# Patient Record
Sex: Female | Born: 1945 | Hispanic: Yes | Marital: Married | Smoking: Former smoker
Health system: Southern US, Community
[De-identification: ages and names within clinical notes are randomized; demographics above are authoritative.]

## PROBLEM LIST (undated history)

## (undated) DIAGNOSIS — L0390 Cellulitis, unspecified: Secondary | ICD-10-CM

## (undated) DIAGNOSIS — F32A Depression, unspecified: Secondary | ICD-10-CM

## (undated) DIAGNOSIS — T4145XA Adverse effect of unspecified anesthetic, initial encounter: Secondary | ICD-10-CM

## (undated) DIAGNOSIS — J449 Chronic obstructive pulmonary disease, unspecified: Secondary | ICD-10-CM

## (undated) DIAGNOSIS — F410 Panic disorder [episodic paroxysmal anxiety] without agoraphobia: Secondary | ICD-10-CM

## (undated) DIAGNOSIS — C539 Malignant neoplasm of cervix uteri, unspecified: Secondary | ICD-10-CM

## (undated) DIAGNOSIS — J189 Pneumonia, unspecified organism: Secondary | ICD-10-CM

## (undated) DIAGNOSIS — R079 Chest pain, unspecified: Secondary | ICD-10-CM

## (undated) DIAGNOSIS — H269 Unspecified cataract: Secondary | ICD-10-CM

## (undated) DIAGNOSIS — F259 Schizoaffective disorder, unspecified: Secondary | ICD-10-CM

## (undated) DIAGNOSIS — T8859XA Other complications of anesthesia, initial encounter: Secondary | ICD-10-CM

## (undated) DIAGNOSIS — I517 Cardiomegaly: Secondary | ICD-10-CM

## (undated) DIAGNOSIS — G4730 Sleep apnea, unspecified: Secondary | ICD-10-CM

## (undated) DIAGNOSIS — R252 Cramp and spasm: Secondary | ICD-10-CM

## (undated) DIAGNOSIS — J45909 Unspecified asthma, uncomplicated: Secondary | ICD-10-CM

## (undated) DIAGNOSIS — E039 Hypothyroidism, unspecified: Secondary | ICD-10-CM

## (undated) DIAGNOSIS — Z8619 Personal history of other infectious and parasitic diseases: Secondary | ICD-10-CM

## (undated) DIAGNOSIS — D649 Anemia, unspecified: Secondary | ICD-10-CM

## (undated) DIAGNOSIS — F329 Major depressive disorder, single episode, unspecified: Secondary | ICD-10-CM

## (undated) DIAGNOSIS — M1990 Unspecified osteoarthritis, unspecified site: Secondary | ICD-10-CM

## (undated) DIAGNOSIS — E785 Hyperlipidemia, unspecified: Secondary | ICD-10-CM

## (undated) DIAGNOSIS — Z9181 History of falling: Secondary | ICD-10-CM

## (undated) DIAGNOSIS — E119 Type 2 diabetes mellitus without complications: Secondary | ICD-10-CM

## (undated) DIAGNOSIS — J40 Bronchitis, not specified as acute or chronic: Secondary | ICD-10-CM

## (undated) DIAGNOSIS — I8390 Asymptomatic varicose veins of unspecified lower extremity: Secondary | ICD-10-CM

## (undated) DIAGNOSIS — F419 Anxiety disorder, unspecified: Secondary | ICD-10-CM

## (undated) DIAGNOSIS — R011 Cardiac murmur, unspecified: Secondary | ICD-10-CM

## (undated) HISTORY — DX: Type 2 diabetes mellitus without complications: E11.9

## (undated) HISTORY — DX: History of falling: Z91.81

## (undated) HISTORY — DX: Unspecified asthma, uncomplicated: J45.909

## (undated) HISTORY — DX: Chronic obstructive pulmonary disease, unspecified: J44.9

## (undated) HISTORY — DX: Personal history of other infectious and parasitic diseases: Z86.19

## (undated) HISTORY — DX: Cardiomegaly: I51.7

## (undated) HISTORY — PX: GASTRIC BYPASS: SHX52

## (undated) HISTORY — PX: GALLBLADDER SURGERY: SHX652

## (undated) HISTORY — DX: Hypothyroidism, unspecified: E03.9

## (undated) HISTORY — DX: Bronchitis, not specified as acute or chronic: J40

## (undated) HISTORY — PX: BREAST LUMPECTOMY: SHX2

## (undated) HISTORY — DX: Schizoaffective disorder, unspecified: F25.9

## (undated) HISTORY — PX: CHOLECYSTECTOMY: SHX55

## (undated) HISTORY — PX: OTHER SURGICAL HISTORY: SHX169

## (undated) HISTORY — DX: Cellulitis, unspecified: L03.90

## (undated) HISTORY — PX: TUBAL LIGATION: SHX77

## (undated) HISTORY — DX: Malignant neoplasm of cervix uteri, unspecified: C53.9

---

## 2003-11-04 ENCOUNTER — Other Ambulatory Visit: Payer: Self-pay

## 2004-03-22 ENCOUNTER — Other Ambulatory Visit: Payer: Self-pay

## 2004-03-23 ENCOUNTER — Inpatient Hospital Stay: Payer: Self-pay | Admitting: Psychiatry

## 2004-04-29 ENCOUNTER — Emergency Department: Payer: Self-pay | Admitting: Emergency Medicine

## 2004-11-10 ENCOUNTER — Emergency Department: Payer: Self-pay | Admitting: Emergency Medicine

## 2004-12-07 ENCOUNTER — Encounter: Payer: Self-pay | Admitting: Anesthesiology

## 2004-12-13 ENCOUNTER — Ambulatory Visit: Payer: Self-pay | Admitting: Anesthesiology

## 2004-12-15 ENCOUNTER — Encounter: Payer: Self-pay | Admitting: Anesthesiology

## 2005-01-26 ENCOUNTER — Ambulatory Visit: Payer: Self-pay | Admitting: Anesthesiology

## 2005-04-13 ENCOUNTER — Ambulatory Visit: Payer: Self-pay | Admitting: Anesthesiology

## 2005-10-05 ENCOUNTER — Ambulatory Visit: Payer: Self-pay

## 2006-06-01 ENCOUNTER — Emergency Department: Payer: Self-pay | Admitting: General Practice

## 2006-10-16 ENCOUNTER — Emergency Department: Payer: Self-pay | Admitting: Emergency Medicine

## 2006-11-06 ENCOUNTER — Inpatient Hospital Stay: Payer: Self-pay | Admitting: Psychiatry

## 2008-05-28 ENCOUNTER — Ambulatory Visit: Payer: Self-pay | Admitting: Podiatry

## 2008-10-26 ENCOUNTER — Ambulatory Visit: Payer: Self-pay

## 2008-11-03 ENCOUNTER — Encounter: Payer: Self-pay | Admitting: Otolaryngology

## 2008-11-14 ENCOUNTER — Encounter: Payer: Self-pay | Admitting: Otolaryngology

## 2008-12-15 ENCOUNTER — Encounter: Payer: Self-pay | Admitting: Otolaryngology

## 2009-01-24 ENCOUNTER — Encounter: Payer: Self-pay | Admitting: Otolaryngology

## 2009-02-14 ENCOUNTER — Encounter: Payer: Self-pay | Admitting: Otolaryngology

## 2009-08-15 ENCOUNTER — Emergency Department: Payer: Self-pay | Admitting: Emergency Medicine

## 2010-03-03 ENCOUNTER — Ambulatory Visit: Payer: Self-pay | Admitting: Podiatry

## 2010-03-06 LAB — PATHOLOGY REPORT

## 2010-11-01 ENCOUNTER — Ambulatory Visit: Payer: Self-pay | Admitting: Ophthalmology

## 2011-01-19 DIAGNOSIS — E559 Vitamin D deficiency, unspecified: Secondary | ICD-10-CM | POA: Insufficient documentation

## 2011-01-19 DIAGNOSIS — E611 Iron deficiency: Secondary | ICD-10-CM | POA: Insufficient documentation

## 2011-01-19 DIAGNOSIS — E039 Hypothyroidism, unspecified: Secondary | ICD-10-CM | POA: Insufficient documentation

## 2011-04-02 ENCOUNTER — Ambulatory Visit: Payer: Self-pay | Admitting: Anesthesiology

## 2011-04-20 ENCOUNTER — Ambulatory Visit: Payer: Self-pay

## 2011-04-20 LAB — RAPID INFLUENZA A&B ANTIGENS

## 2011-04-22 LAB — BETA STREP CULTURE(ARMC)

## 2011-05-18 ENCOUNTER — Ambulatory Visit: Payer: Self-pay | Admitting: Internal Medicine

## 2011-05-22 ENCOUNTER — Ambulatory Visit: Payer: Self-pay | Admitting: Internal Medicine

## 2011-05-24 LAB — BASIC METABOLIC PANEL
BUN: 15 mg/dL (ref 7–18)
Chloride: 106 mmol/L (ref 98–107)
EGFR (Non-African Amer.): 41 — ABNORMAL LOW
Osmolality: 292 (ref 275–301)
Potassium: 4.4 mmol/L (ref 3.5–5.1)
Sodium: 140 mmol/L (ref 136–145)

## 2011-05-24 LAB — CBC CANCER CENTER
Basophil %: 0.1 %
Eosinophil %: 0.3 %
HCT: 37.7 % (ref 35.0–47.0)
HGB: 12.3 g/dL (ref 12.0–16.0)
Lymphocyte %: 18.9 %
Monocyte #: 0.3 x10 3/mm (ref 0.0–0.7)
Neutrophil %: 75.5 %
Platelet: 225 x10 3/mm (ref 150–440)
WBC: 4.9 x10 3/mm (ref 3.6–11.0)

## 2011-05-24 LAB — HEPATIC FUNCTION PANEL A (ARMC)
Alkaline Phosphatase: 93 U/L (ref 50–136)
SGPT (ALT): 50 U/L

## 2011-05-24 LAB — APTT: Activated PTT: 27.4 secs (ref 23.6–35.9)

## 2011-06-02 ENCOUNTER — Ambulatory Visit: Payer: Self-pay | Admitting: Family Medicine

## 2011-06-02 LAB — URINALYSIS, COMPLETE
Bacteria: NEGATIVE
Glucose,UR: NEGATIVE mg/dL (ref 0–75)
Nitrite: NEGATIVE
Specific Gravity: 1.01 (ref 1.003–1.030)

## 2011-06-15 ENCOUNTER — Ambulatory Visit: Payer: Self-pay | Admitting: Internal Medicine

## 2011-06-20 DIAGNOSIS — G4700 Insomnia, unspecified: Secondary | ICD-10-CM | POA: Insufficient documentation

## 2011-06-20 DIAGNOSIS — K589 Irritable bowel syndrome without diarrhea: Secondary | ICD-10-CM | POA: Insufficient documentation

## 2011-06-20 DIAGNOSIS — I10 Essential (primary) hypertension: Secondary | ICD-10-CM | POA: Insufficient documentation

## 2011-06-22 ENCOUNTER — Ambulatory Visit: Payer: Self-pay | Admitting: Internal Medicine

## 2011-09-18 ENCOUNTER — Ambulatory Visit: Payer: Self-pay | Admitting: Family Medicine

## 2012-05-04 ENCOUNTER — Ambulatory Visit: Payer: Self-pay | Admitting: Family Medicine

## 2012-05-08 ENCOUNTER — Emergency Department: Payer: Self-pay | Admitting: Urology

## 2012-05-08 LAB — CK TOTAL AND CKMB (NOT AT ARMC): CK, Total: 54 U/L (ref 21–215)

## 2012-05-08 LAB — COMPREHENSIVE METABOLIC PANEL
Albumin: 3.1 g/dL — ABNORMAL LOW (ref 3.4–5.0)
Alkaline Phosphatase: 97 U/L (ref 50–136)
BUN: 15 mg/dL (ref 7–18)
Chloride: 110 mmol/L — ABNORMAL HIGH (ref 98–107)
Creatinine: 1.19 mg/dL (ref 0.60–1.30)
EGFR (African American): 56 — ABNORMAL LOW
Osmolality: 294 (ref 275–301)
Potassium: 4.8 mmol/L (ref 3.5–5.1)
SGPT (ALT): 84 U/L — ABNORMAL HIGH (ref 12–78)
Sodium: 139 mmol/L (ref 136–145)
Total Protein: 7.1 g/dL (ref 6.4–8.2)

## 2012-05-08 LAB — TROPONIN I: Troponin-I: <0.02 ng/mL

## 2012-05-08 LAB — CBC
HGB: 11.9 g/dL — ABNORMAL LOW (ref 12.0–16.0)
MCH: 33.4 pg (ref 26.0–34.0)
MCHC: 32.4 g/dL (ref 32.0–36.0)

## 2012-07-27 ENCOUNTER — Ambulatory Visit: Payer: Self-pay

## 2012-07-31 DIAGNOSIS — E66812 Obesity, class 2: Secondary | ICD-10-CM | POA: Insufficient documentation

## 2012-09-10 ENCOUNTER — Ambulatory Visit: Payer: Self-pay | Admitting: Internal Medicine

## 2012-10-06 ENCOUNTER — Ambulatory Visit: Payer: Self-pay

## 2012-10-06 LAB — LIPID PANEL
Cholesterol: 172 mg/dL (ref 0–200)
HDL Cholesterol: 66 mg/dL — ABNORMAL HIGH (ref 40–60)
Ldl Cholesterol, Calc: 89 mg/dL (ref 0–100)
Triglycerides: 83 mg/dL (ref 0–200)
VLDL Cholesterol, Calc: 17 mg/dL (ref 5–40)

## 2012-10-06 LAB — CBC WITH DIFFERENTIAL/PLATELET
Basophil #: 0 10*3/uL (ref 0.0–0.1)
Eosinophil #: 0.1 10*3/uL (ref 0.0–0.7)
Eosinophil %: 2.4 %
HCT: 35.4 % (ref 35.0–47.0)
HGB: 11.6 g/dL — ABNORMAL LOW (ref 12.0–16.0)
Lymphocyte %: 35.5 %
MCH: 32.5 pg (ref 26.0–34.0)
MCV: 99 fL (ref 80–100)
Monocyte %: 7.7 %
Neutrophil #: 3.2 10*3/uL (ref 1.4–6.5)
RBC: 3.57 10*6/uL — ABNORMAL LOW (ref 3.80–5.20)

## 2012-10-06 LAB — COMPREHENSIVE METABOLIC PANEL
BUN: 18 mg/dL (ref 7–18)
Bilirubin,Total: 0.3 mg/dL (ref 0.2–1.0)
Chloride: 106 mmol/L (ref 98–107)
Creatinine: 1.17 mg/dL (ref 0.60–1.30)
EGFR (African American): 57 — ABNORMAL LOW
EGFR (Non-African Amer.): 49 — ABNORMAL LOW
Glucose: 95 mg/dL (ref 65–99)
SGOT(AST): 20 U/L (ref 15–37)

## 2012-10-10 LAB — CBC
HGB: 11.1 g/dL — ABNORMAL LOW (ref 12.0–16.0)
MCH: 33 pg (ref 26.0–34.0)
MCV: 100 fL (ref 80–100)
RDW: 15.4 % — ABNORMAL HIGH (ref 11.5–14.5)

## 2012-10-10 LAB — BASIC METABOLIC PANEL
BUN: 18 mg/dL (ref 7–18)
Calcium, Total: 7.9 mg/dL — ABNORMAL LOW (ref 8.5–10.1)
Co2: 21 mmol/L (ref 21–32)
Creatinine: 1.25 mg/dL (ref 0.60–1.30)
EGFR (African American): 53 — ABNORMAL LOW
Glucose: 243 mg/dL — ABNORMAL HIGH (ref 65–99)
Osmolality: 287 (ref 275–301)

## 2012-10-10 LAB — PRO B NATRIURETIC PEPTIDE: B-Type Natriuretic Peptide: 102 pg/mL (ref 0–125)

## 2012-10-11 ENCOUNTER — Observation Stay: Payer: Self-pay | Admitting: Internal Medicine

## 2012-10-11 DIAGNOSIS — I319 Disease of pericardium, unspecified: Secondary | ICD-10-CM

## 2012-10-11 DIAGNOSIS — R079 Chest pain, unspecified: Secondary | ICD-10-CM

## 2012-10-11 LAB — CK TOTAL AND CKMB (NOT AT ARMC)
CK, Total: 64 U/L (ref 21–215)
CK, Total: 65 U/L (ref 21–215)
CK, Total: 59 U/L (ref 21–215)
CK-MB: 1.5 ng/mL (ref 0.5–3.6)
CK-MB: 0.7 ng/mL (ref 0.5–3.6)

## 2012-10-11 LAB — TROPONIN I: Troponin-I: <0.02 ng/mL

## 2012-10-11 LAB — LIPID PANEL
Cholesterol: 152 mg/dL (ref 0–200)
HDL Cholesterol: 65 mg/dL — ABNORMAL HIGH (ref 40–60)
Triglycerides: 91 mg/dL (ref 0–200)

## 2012-10-30 ENCOUNTER — Encounter: Payer: Self-pay | Admitting: Cardiovascular Disease

## 2012-10-30 ENCOUNTER — Ambulatory Visit (INDEPENDENT_AMBULATORY_CARE_PROVIDER_SITE_OTHER): Payer: Medicaid Other | Admitting: Cardiovascular Disease

## 2012-10-30 VITALS — BP 120/60 | HR 86 | Ht 62.5 in | Wt 234.8 lb

## 2012-10-30 DIAGNOSIS — I313 Pericardial effusion (noninflammatory): Secondary | ICD-10-CM | POA: Insufficient documentation

## 2012-10-30 DIAGNOSIS — R079 Chest pain, unspecified: Secondary | ICD-10-CM

## 2012-10-30 DIAGNOSIS — R0602 Shortness of breath: Secondary | ICD-10-CM

## 2012-10-30 DIAGNOSIS — I319 Disease of pericardium, unspecified: Secondary | ICD-10-CM

## 2012-10-30 DIAGNOSIS — I3139 Other pericardial effusion (noninflammatory): Secondary | ICD-10-CM | POA: Insufficient documentation

## 2012-10-30 NOTE — Assessment & Plan Note (Signed)
The patient and husband report prolonged history of chest discomfort associated with dyspnea with minimal activities. She has multiple risk factors for coronary artery disease including diabetes, hypertension, hyperlipidemia, obesity and previous tobacco use. I think once her pericardial effusion results, we can consider further ischemic workup.

## 2012-10-30 NOTE — Progress Notes (Signed)
HPI  This is a 67 year old Hispanic female who is here today for followup visit after recent hospitalization at [facility]. She is here with her husband as well as an interpreter. She has multiple medical problems that include prolonged history of diabetes, obesity, hypertension, hyperlipidemia, COPD with previous tobacco use and schizophrenia. She went to see Dr.Saadat Welton Flakes recently for evaluation of dyspnea and COPD. She underwent an echocardiogram in his office which showed pericardial effusion and thus she was sent to the emergency room at [facility] where she was hospitalized briefly. We repeat an echocardiogram there which showed moderate-sized pericardial effusion With thick fibrinous material without evidence of tomponade. Ejection fraction was normal. She also reported chronic substernal chest discomfort as well as dyspnea with minimal activities. Her cardiac enzymes were negative. Connective tissue disease workup was negative. PPD skin test was placed which came back positive at followup. She reports having sputum that done which was negative for active TB.    No Known Allergies   No current outpatient prescriptions on file prior to visit.   No current facility-administered medications on file prior to visit.     Past Medical History  Diagnosis Date  . Enlarged heart   . Hypothyroidism   . Asthma   . History of shingles   . Cellulitis   . Diabetes mellitus without complication   . History of fall   . Bronchitis   . Cancer of cervix   . Schizoaffective disorder   . COPD (chronic obstructive pulmonary disease)      Past Surgical History  Procedure Laterality Date  . Gallbladder surgery    . Gastric bypass    . Cervical surgery    . Breast lumpectomy    . Cesarean section       History reviewed. No pertinent family history.   History   Social History  . Marital Status: Married    Spouse Name: N/A    Number of Children: N/A  . Years of Education: N/A   Occupational  History  . Not on file.   Social History Main Topics  . Smoking status: Former Smoker -- 1.00 packs/day for 20 years    Types: Cigarettes  . Smokeless tobacco: Not on file  . Alcohol Use: No  . Drug Use: No  . Sexually Active: Not on file   Other Topics Concern  . Not on file   Social History Narrative  . No narrative on file     PHYSICAL EXAM   BP 120/60  Pulse 86  Ht 5' 2.5" (1.588 m)  Wt 234 lb 12 oz (106.482 kg)  BMI 42.23 kg/m2 Constitutional: She is oriented to person, place, and time. She appears well-developed and well-nourished. No distress.  She looks older than her stated age HENT: No nasal discharge.  Head: Normocephalic and atraumatic.  Eyes: Pupils are equal and round. Neck: Normal range of motion. Neck supple. No JVD present. No thyromegaly present.  Cardiovascular: Normal rate, regular rhythm, normal heart sounds. Exam reveals no gallop and no friction rub. No murmur heard.  Pulmonary/Chest: Effort normal and breath sounds normal. No stridor. No respiratory distress. She has no wheezes. She has no rales. She exhibits no tenderness.  Abdominal: Soft. Bowel sounds are normal. She exhibits no distension. There is no tenderness. There is no rebound and no guarding.  Musculoskeletal: Normal range of motion. She exhibits no edema and no tenderness.  Neurological: She is alert and oriented to person, place, and time. Coordination normal.  Skin: Skin is warm and dry. No rash noted. She is not diaphoretic. No erythema. No pallor.  Psychiatric: She has a normal mood and affect. Her behavior is normal. Judgment and thought content normal.     EKG: normal sinus rhythm with low voltage and poor R-wave progression in the anterior leads.   ASSESSMENT AND PLAN

## 2012-10-30 NOTE — Assessment & Plan Note (Signed)
She had a recent pericardial effusion and continues to have chest pain and dyspnea. I recommend a followup echocardiogram to make sure that there is no progression of pericardial effusion. The etiology of her pericardial effusion is still not clear but likely idiopathic. PPD was positive but sputum test was negative.

## 2012-10-30 NOTE — Patient Instructions (Addendum)
Your physician has requested that you have an echocardiogram. Echocardiography is a painless test that uses sound waves to create images of your heart. It provides your doctor with information about the size and shape of your heart and how well your heart's chambers and valves are working. This procedure takes approximately one hour. There are no restrictions for this procedure.  Follow up as needed 

## 2012-11-18 ENCOUNTER — Other Ambulatory Visit (INDEPENDENT_AMBULATORY_CARE_PROVIDER_SITE_OTHER): Payer: Medicaid Other

## 2012-11-18 ENCOUNTER — Other Ambulatory Visit: Payer: Self-pay

## 2012-11-18 DIAGNOSIS — I319 Disease of pericardium, unspecified: Secondary | ICD-10-CM

## 2012-11-18 DIAGNOSIS — I313 Pericardial effusion (noninflammatory): Secondary | ICD-10-CM

## 2012-11-18 DIAGNOSIS — R0602 Shortness of breath: Secondary | ICD-10-CM

## 2012-11-18 DIAGNOSIS — R079 Chest pain, unspecified: Secondary | ICD-10-CM

## 2012-12-02 ENCOUNTER — Encounter: Payer: Self-pay | Admitting: Cardiovascular Disease

## 2012-12-02 ENCOUNTER — Ambulatory Visit (INDEPENDENT_AMBULATORY_CARE_PROVIDER_SITE_OTHER): Payer: Medicaid Other | Admitting: Cardiovascular Disease

## 2012-12-02 VITALS — BP 110/68 | HR 73 | Ht 65.0 in | Wt 235.0 lb

## 2012-12-02 DIAGNOSIS — I319 Disease of pericardium, unspecified: Secondary | ICD-10-CM

## 2012-12-02 DIAGNOSIS — R079 Chest pain, unspecified: Secondary | ICD-10-CM

## 2012-12-02 DIAGNOSIS — I313 Pericardial effusion (noninflammatory): Secondary | ICD-10-CM

## 2012-12-02 NOTE — Assessment & Plan Note (Signed)
Likely related to pericardial effusion. However, if this persists, ischemic evaluation with stress testing will be pursued due to multiple risk factors.

## 2012-12-02 NOTE — Patient Instructions (Addendum)
You have been referred to [facility]. Appointment on Wednesday, December 10, 2012 at 9:30 with Dr Jorja Loa arrive at 9:15 for check-in, office phone number [phone], [address], [place], [place] [address]  Your physician recommends that you continue on your current medications as directed. Please refer to the Current Medication list given to you today.

## 2012-12-02 NOTE — Assessment & Plan Note (Signed)
This was diagnosed in July and still moderate size and persistent on follow up echo 2 week ago. The etiology of this is still not clear. I am concerned about the thick fibrinous material in the pericardial fluid. Even if the effusion resolves without intervention, this might lead to scaring and future constriction. Also tuberculous pericardial disease has not been excluded.  Due to that I recommend pericardial window and biopsy for therapeutic and diagnostic purposes. Risk, benefits and alternatives were discussed.  I am referring her to cardiothoracic surgery.

## 2012-12-02 NOTE — Progress Notes (Signed)
HPI  This is a 67 year old Hispanic female who is here today for followup visit regarding pericardial effusion. She is here with her husband as well as an interpreter. She has multiple medical problems that include prolonged history of diabetes, obesity, hypertension, hyperlipidemia, COPD with previous tobacco use and schizophrenia. She went to see Dr.Saadat Welton Flakes recently for evaluation of dyspnea and COPD. She underwent an echocardiogram in his office which showed pericardial effusion and thus she was sent to the emergency room at [facility] where she was hospitalized briefly in July. We repeated an echocardiogram there while hospitalized which showed moderate-sized pericardial effusion With thick fibrinous material without evidence of tomponade. Ejection fraction was normal. She also reported chronic substernal chest discomfort as well as dyspnea with minimal activities. Her cardiac enzymes were negative. Connective tissue disease workup was negative. PPD skin test was placed which came back positive at followup. [facility] and reports negative sputum test for TB X3 as well as negative testing for HIV and Syphilis.   We repeated the echo 2 weeks ago which showed persistent moderate size pericardial effusion with very thick fibrinous material . She is feeling slightly better but still with chest pain worse with deep breath.   No Known Allergies   Current Outpatient Prescriptions on File Prior to Visit  Medication Sig Dispense Refill  . albuterol (PROVENTIL HFA;VENTOLIN HFA) 108 (90 BASE) MCG/ACT inhaler Inhale 2 puffs into the lungs every 6 (six) hours as needed for wheezing.      . Aspirin Buf,AlHyd-MgHyd-CaCar, (ASCRIPTIN) 325 MG TABS Take by mouth daily.      . benztropine (COGENTIN) 1 MG tablet Take 1 mg by mouth 2 (two) times daily.      . calcitRIOL (ROCALTROL) 0.25 MCG capsule Take 0.25 mcg by mouth daily.      . Cholecalciferol (VITAMIN D3) 50000 UNITS CAPS Take by  mouth every 21 ( twenty-one) days.      . clonazePAM (KLONOPIN) 1 MG tablet Take 2 mg by mouth daily.      . Fluticasone-Salmeterol (ADVAIR) 100-50 MCG/DOSE AEPB Inhale 1 puff into the lungs every 12 (twelve) hours.      Marland Kitchen guaiFENesin-codeine (ROBITUSSIN AC) 100-10 MG/5ML syrup Take 5 mLs by mouth as needed for cough.      Marland Kitchen ibuprofen (ADVIL,MOTRIN) 600 MG tablet Take 600 mg by mouth 3 (three) times daily as needed for pain.      . Liraglutide (VICTOZA) 18 MG/3ML SOPN Inject into the skin daily.      Marland Kitchen lubiprostone (AMITIZA) 24 MCG capsule Take 24 mcg by mouth 2 (two) times daily with a meal.      . metoprolol tartrate (LOPRESSOR) 12.5 mg TABS Take 12.5 mg by mouth 2 (two) times daily.      . simvastatin (ZOCOR) 10 MG tablet Take 10 mg by mouth at bedtime.      . temazepam (RESTORIL) 15 MG capsule Take 15 mg by mouth 2 (two) times daily.      Marland Kitchen tiotropium (SPIRIVA) 18 MCG inhalation capsule Place 18 mcg into inhaler and inhale daily.      Marland Kitchen topiramate (TOPAMAX) 100 MG tablet Take 100 mg by mouth 2 (two) times daily.      . traZODone (DESYREL) 100 MG tablet Take 300 mg by mouth at bedtime.      . ziprasidone (GEODON) 80 MG capsule Take 80 mg by mouth 2 (two) times daily with a meal.  No current facility-administered medications on file prior to visit.     Past Medical History  Diagnosis Date  . Enlarged heart   . Hypothyroidism   . Asthma   . History of shingles   . Cellulitis   . Diabetes mellitus without complication   . History of fall   . Bronchitis   . Cancer of cervix   . Schizoaffective disorder   . COPD (chronic obstructive pulmonary disease)      Past Surgical History  Procedure Laterality Date  . Gallbladder surgery    . Gastric bypass    . Cervical surgery    . Breast lumpectomy    . Cesarean section       No family history on file.   History   Social History  . Marital Status: Married    Spouse Name: N/A    Number of Children: N/A  . Years of  Education: N/A   Occupational History  . Not on file.   Social History Main Topics  . Smoking status: Former Smoker -- 1.00 packs/day for 20 years    Types: Cigarettes  . Smokeless tobacco: Not on file  . Alcohol Use: No  . Drug Use: No  . Sexual Activity: Not on file   Other Topics Concern  . Not on file   Social History Narrative  . No narrative on file     PHYSICAL EXAM   BP 110/68  Pulse 73  Ht 5\' 5"  (1.651 m)  Wt 235 lb (106.595 kg)  BMI 39.11 kg/m2  SpO2 95% Constitutional: She is oriented to person, place, and time. She appears well-developed and well-nourished. No distress.  She looks older than her stated age HENT: No nasal discharge.  Head: Normocephalic and atraumatic.  Eyes: Pupils are equal and round. Neck: Normal range of motion. Neck supple. No JVD present. No thyromegaly present.  Cardiovascular: Normal rate, regular rhythm, normal heart sounds. Exam reveals no gallop and no friction rub. No murmur heard.  Pulmonary/Chest: Effort normal and breath sounds normal. No stridor. No respiratory distress. She has no wheezes. She has no rales. She exhibits no tenderness.  Abdominal: Soft. Bowel sounds are normal. She exhibits no distension. There is no tenderness. There is no rebound and no guarding.  Musculoskeletal: Normal range of motion. She exhibits no edema and no tenderness.  Neurological: She is alert and oriented to person, place, and time. Coordination normal.  Skin: Skin is warm and dry. No rash noted. She is not diaphoretic. No erythema. No pallor.  Psychiatric: She has a normal mood and affect. Her behavior is normal. Judgment and thought content normal.     ASSESSMENT AND PLAN

## 2012-12-10 ENCOUNTER — Encounter: Payer: Medicaid Other | Admitting: Cardiothoracic Surgery

## 2012-12-11 ENCOUNTER — Other Ambulatory Visit: Payer: Self-pay | Admitting: *Deleted

## 2012-12-11 ENCOUNTER — Encounter: Payer: Self-pay | Admitting: Cardiothoracic Surgery

## 2012-12-11 ENCOUNTER — Institutional Professional Consult (permissible substitution) (INDEPENDENT_AMBULATORY_CARE_PROVIDER_SITE_OTHER): Payer: Medicaid Other | Admitting: Cardiothoracic Surgery

## 2012-12-11 VITALS — BP 133/73 | HR 73 | Ht 65.0 in | Wt 235.0 lb

## 2012-12-11 DIAGNOSIS — F209 Schizophrenia, unspecified: Secondary | ICD-10-CM

## 2012-12-11 DIAGNOSIS — I319 Disease of pericardium, unspecified: Secondary | ICD-10-CM

## 2012-12-11 DIAGNOSIS — I313 Pericardial effusion (noninflammatory): Secondary | ICD-10-CM

## 2012-12-11 DIAGNOSIS — R0602 Shortness of breath: Secondary | ICD-10-CM

## 2012-12-11 LAB — BUN: BUN: 17 mg/dL (ref 6–23)

## 2012-12-11 LAB — CREATININE, SERUM: Creat: 1.26 mg/dL — ABNORMAL HIGH (ref 0.50–1.10)

## 2012-12-11 NOTE — Progress Notes (Signed)
PCP is Gwenlyn Found, MD Referring Provider is Iran Ouch, MD  Chief Complaint  Patient presents with  . Pericardial Effusion    Surgcial eval, 2D ECHO 11/18/12    patient examined, most recent 2-D echocardiogram reviewed  HPI: 67 year old obese non- diabetic schizophrenic Hispanic female COPD evaluated by Dr.Arida in [facility] for pericarditis with a significant pericardial effusion which has not resolved on serial echocardiograms. The patient is symptomatic with chest pain and shortness of breath. She denies fever orthopnea or syncope. She is fairly sedentary, fragile and chronically ill with significant psychiatric disease. Her 2-D echocardiogram reveals normal LV function with some loculated areas of pleural effusion measuring up to 2.5 cm. No significant valvular disease is noted. There is some stranding of fibrinous material in the pericardial space. Dr. Kary Kos was concerned that if  there is no resolution of the pericarditis with time and that she could develop constriction if she did not undergo a drainage with pericardial window.  There is no history of coronary disease or prior mark or infarction. No history of significant arrhythmia or prior history of significant CHF.  Past Medical History  Diagnosis Date  . Enlarged heart   . Hypothyroidism   . Asthma   . History of shingles   . Cellulitis   . Diabetes mellitus without complication   . History of fall   . Bronchitis   . Cancer of cervix   . Schizoaffective disorder   . COPD (chronic obstructive pulmonary disease)     Past Surgical History  Procedure Laterality Date  . Gallbladder surgery    . Gastric bypass    . Cervical surgery    . Breast lumpectomy    . Cesarean section      No family history on file.  Social History History  Substance Use Topics  . Smoking status: Former Smoker -- 1.00 packs/day for 20 years    Types: Cigarettes  . Smokeless tobacco: Never Used  . Alcohol Use: No    Current  Outpatient Prescriptions  Medication Sig Dispense Refill  . albuterol (PROVENTIL HFA;VENTOLIN HFA) 108 (90 BASE) MCG/ACT inhaler Inhale 2 puffs into the lungs every 6 (six) hours as needed for wheezing.      . Aspirin Buf,AlHyd-MgHyd-CaCar, (ASCRIPTIN) 325 MG TABS Take by mouth daily.      . benztropine (COGENTIN) 1 MG tablet Take 1 mg by mouth 2 (two) times daily.      . calcitRIOL (ROCALTROL) 0.25 MCG capsule Take 0.25 mcg by mouth daily.      . Cholecalciferol (VITAMIN D3) 50000 UNITS CAPS Take by mouth every 21 ( twenty-one) days.      . clonazePAM (KLONOPIN) 1 MG tablet Take 2 mg by mouth daily.      . Fluticasone-Salmeterol (ADVAIR) 100-50 MCG/DOSE AEPB Inhale 1 puff into the lungs every 12 (twelve) hours.      Marland Kitchen guaiFENesin-codeine (ROBITUSSIN AC) 100-10 MG/5ML syrup Take 5 mLs by mouth as needed for cough.      Marland Kitchen ibuprofen (ADVIL,MOTRIN) 600 MG tablet Take 600 mg by mouth 3 (three) times daily as needed for pain.      . Liraglutide (VICTOZA) 18 MG/3ML SOPN Inject into the skin daily.      Marland Kitchen lubiprostone (AMITIZA) 24 MCG capsule Take 24 mcg by mouth 2 (two) times daily with a meal.      . metoprolol tartrate (LOPRESSOR) 12.5 mg TABS Take 12.5 mg by mouth 2 (two) times daily.      Marland Kitchen  simvastatin (ZOCOR) 10 MG tablet Take 10 mg by mouth at bedtime.      . temazepam (RESTORIL) 15 MG capsule Take 15 mg by mouth 2 (two) times daily.      Marland Kitchen tiotropium (SPIRIVA) 18 MCG inhalation capsule Place 18 mcg into inhaler and inhale daily.      Marland Kitchen topiramate (TOPAMAX) 100 MG tablet Take 100 mg by mouth 2 (two) times daily.      . traZODone (DESYREL) 100 MG tablet Take 300 mg by mouth at bedtime.      . ziprasidone (GEODON) 80 MG capsule Take 80 mg by mouth 2 (two) times daily with a meal.       No current facility-administered medications for this visit.    No Known Allergies  Review of Systems Obese Hispanic female with poor mobility but able to walk down the office hallway with a shuffled gait She  denies diabetes. She denies recent admission for pulmonary problems but does have a history of multiple admissions for COPD flareup and asthma in the past. She denies any bleeding problems or blood disorder Her last hospitalization f was for gastric bypass out of town  BP 133/73  Pulse 73  Ht 5\' 5"  (1.651 m)  Wt 235 lb (106.595 kg)  BMI 39.11 kg/m2  SpO2 97% Physical Exam Chronically ill fragile but obese Hispanic female HEENT normocephalic pupils equal dentition adequate Neck without JVD No bruit Breath sounds diminished bilaterally Cardiac regular rhythm without rub or murmur Abdomen obese nontender Extremities mild pedal edema nonpalpable pedal pulses Neuro generally weak no focal motor deficit  Diagnostic Tests: 2-D echo shows persistent pericardial effusion more significant posteriorly with some fibrinous material and loculation  Impression: Patient has persistent pericarditis very symptomatic and agree that subxiphoid pericardial window with irrigation and debridement of the pericardial space with help the patient's symptoms and possibly prevent further constrictive problems. However she would be at increased risk for surgery to do her chronic pulmonary disease, or psychiatric disorder, and her morbid obesity. The benefit of the surgery as well as the risk of surgery were clearly discussed with the patient and her husband through a formal interpreter Marchelle Folks from [place] (and all aspects of the indications benefits alternatives and risks were discussed and the details of the actual operative procedure also reviewed. I scheduled a CT scan of chest to assess the patient's pulmonary disease prior to surgery which will be scheduled for September 5 at [facility].  Plan:Subxiphoid pericardial window September 5- will need preoperative PFTs and ABGs as well as preoperative CT scan and chest. Patient was given a prescription for Vibramycin to cover a possible mycoplasma pericarditis.

## 2012-12-12 ENCOUNTER — Other Ambulatory Visit: Payer: Self-pay | Admitting: *Deleted

## 2012-12-12 DIAGNOSIS — I313 Pericardial effusion (noninflammatory): Secondary | ICD-10-CM

## 2012-12-16 ENCOUNTER — Encounter (HOSPITAL_COMMUNITY)
Admission: RE | Admit: 2012-12-16 | Discharge: 2012-12-16 | Disposition: A | Discharge status: Home / Self Care | Payer: Medicaid Other | Source: Ambulatory Visit | Attending: Cardiothoracic Surgery | Admitting: Cardiothoracic Surgery

## 2012-12-16 ENCOUNTER — Encounter (HOSPITAL_COMMUNITY): Payer: Self-pay

## 2012-12-16 VITALS — BP 125/68 | HR 81 | Temp 98.0°F | Resp 20 | Ht 62.5 in | Wt 236.1 lb

## 2012-12-16 DIAGNOSIS — I313 Pericardial effusion (noninflammatory): Secondary | ICD-10-CM

## 2012-12-16 HISTORY — DX: Asymptomatic varicose veins of unspecified lower extremity: I83.90

## 2012-12-16 HISTORY — DX: Other complications of anesthesia, initial encounter: T88.59XA

## 2012-12-16 HISTORY — DX: Anemia, unspecified: D64.9

## 2012-12-16 HISTORY — DX: Pneumonia, unspecified organism: J18.9

## 2012-12-16 HISTORY — DX: Adverse effect of unspecified anesthetic, initial encounter: T41.45XA

## 2012-12-16 HISTORY — DX: Unspecified cataract: H26.9

## 2012-12-16 HISTORY — DX: Chest pain, unspecified: R07.9

## 2012-12-16 HISTORY — DX: Sleep apnea, unspecified: G47.30

## 2012-12-16 HISTORY — DX: Cardiac murmur, unspecified: R01.1

## 2012-12-16 HISTORY — DX: Cramp and spasm: R25.2

## 2012-12-16 LAB — COMPREHENSIVE METABOLIC PANEL
ALT: 20 U/L (ref 0–35)
AST: 19 U/L (ref 0–37)
Albumin: 3.4 g/dL — ABNORMAL LOW (ref 3.5–5.2)
Alkaline Phosphatase: 71 U/L (ref 39–117)
BUN: 20 mg/dL (ref 6–23)
CO2: 22 mEq/L (ref 19–32)
Calcium: 8.8 mg/dL (ref 8.4–10.5)
Chloride: 107 mEq/L (ref 96–112)
Creatinine, Ser: 1.09 mg/dL (ref 0.50–1.10)
GFR calc Af Amer: 61 mL/min — ABNORMAL LOW (ref >90)
GFR calc non Af Amer: 52 mL/min — ABNORMAL LOW (ref >90)
Glucose, Bld: 112 mg/dL — ABNORMAL HIGH (ref 70–99)
Potassium: 4.2 mEq/L (ref 3.5–5.1)
Sodium: 141 mEq/L (ref 135–145)
Total Bilirubin: 0.2 mg/dL — ABNORMAL LOW (ref 0.3–1.2)
Total Protein: 6.9 g/dL (ref 6.0–8.3)

## 2012-12-16 LAB — URINE MICROSCOPIC-ADD ON

## 2012-12-16 LAB — URINALYSIS, ROUTINE W REFLEX MICROSCOPIC
Bilirubin Urine: NEGATIVE
Glucose, UA: NEGATIVE mg/dL
Hgb urine dipstick: NEGATIVE
Ketones, ur: NEGATIVE mg/dL
Nitrite: NEGATIVE
Protein, ur: NEGATIVE mg/dL
Specific Gravity, Urine: 1.007 (ref 1.005–1.030)
Urobilinogen, UA: 0.2 mg/dL (ref 0.0–1.0)
pH: 6 (ref 5.0–8.0)

## 2012-12-16 LAB — BLOOD GAS, ARTERIAL
Acid-base deficit: 5.9 mmol/L — ABNORMAL HIGH (ref 0.0–2.0)
Bicarbonate: 19.2 mEq/L — ABNORMAL LOW (ref 20.0–24.0)
Drawn by: 24486
FIO2: 0.21 %
O2 Saturation: 94.3 %
Patient temperature: 98.6
TCO2: 20.4 mmol/L (ref 0–100)
pCO2 arterial: 39.2 mmHg (ref 35.0–45.0)
pH, Arterial: 7.311 — ABNORMAL LOW (ref 7.350–7.450)
pO2, Arterial: 81.2 mmHg (ref 80.0–100.0)

## 2012-12-16 LAB — CBC
HCT: 34.4 % — ABNORMAL LOW (ref 36.0–46.0)
Hemoglobin: 11.5 g/dL — ABNORMAL LOW (ref 12.0–15.0)
MCH: 32.6 pg (ref 26.0–34.0)
MCHC: 33.4 g/dL (ref 30.0–36.0)
MCV: 97.5 fL (ref 78.0–100.0)
Platelets: 222 10*3/uL (ref 150–400)
RBC: 3.53 MIL/uL — ABNORMAL LOW (ref 3.87–5.11)
RDW: 13.8 % (ref 11.5–15.5)
WBC: 6.2 10*3/uL (ref 4.0–10.5)

## 2012-12-16 LAB — PROTIME-INR
INR: 0.99 (ref 0.00–1.49)
Prothrombin Time: 12.9 seconds (ref 11.6–15.2)

## 2012-12-16 LAB — APTT: aPTT: 25 seconds (ref 24–37)

## 2012-12-16 LAB — SURGICAL PCR SCREEN
MRSA, PCR: NEGATIVE
Staphylococcus aureus: POSITIVE — AB

## 2012-12-16 LAB — TYPE AND SCREEN
ABO/RH(D): A POS
Antibody Screen: NEGATIVE

## 2012-12-16 LAB — ABO/RH: ABO/RH(D): A POS

## 2012-12-16 NOTE — Pre-Procedure Instructions (Signed)
Heather Compton  12/16/2012   Your procedure is scheduled on:  December 19, 2012 at 7:30 AM  Report to [facility] at 5:30 AM.  Call this number if you have problems the morning of surgery: [phone]   Remember:   Do not eat food or drink liquids after midnight.   Take these medicines the morning of surgery with A SIP OF WATER: albuterol (PROVENTIL HFA;VENTOLIN HFA) inhaler, benztropine (COGENTIN), clonazePAM (KLONOPIN), metoprolol tartrate (LOPRESSOR), topiramate (TOPAMAX)        Do not wear jewelry, make-up or nail polish.  Do not wear lotions, powders, or perfumes. You may wear deodorant.  Do not shave 48 hours prior to surgery. Men may shave face and neck.  Do not bring valuables to the hospital.  [facility] is not responsible                   for any belongings or valuables.  Contacts, dentures or bridgework may not be worn into surgery.  Leave suitcase in the car. After surgery it may be brought to your room.  For patients admitted to the hospital, checkout time is 11:00 AM the day of  discharge.    Special Instructions: Shower using CHG 2 nights before surgery and the night before surgery.  If you shower the day of surgery use CHG.  Use special wash - you have one bottle of CHG for all showers.  You should use approximately 1/3 of the bottle for each shower.   Please read over the following fact sheets that you were given: Pain Booklet, Coughing and Deep Breathing, Blood Transfusion Information, MRSA Information and Surgical Site Infection Prevention

## 2012-12-17 ENCOUNTER — Telehealth: Payer: Self-pay

## 2012-12-17 DIAGNOSIS — B958 Unspecified staphylococcus as the cause of diseases classified elsewhere: Secondary | ICD-10-CM

## 2012-12-17 MED ORDER — MUPIROCIN 2 % EX OINT: TOPICAL_OINTMENT | Freq: Two times a day (BID) | CUTANEOUS | Status: DC

## 2012-12-17 NOTE — Consult Note (Signed)
Anesthesiology Chart Review:  67 year old female with chronic pericarditis of undetermined etiology. She has symptoms of chest pain and shortness of breath and is scheduled for subxiphoid pericardial window on 12/19/12 by Dr. Donata Clay. Previous ECHO on 11/18/12 showed normal LV function  with some loculated areas of pleural effusion measuring up to 2.5 cm. No significant valvular disease is noted. No evidence of tamponade.   PMH:  1. Longstanding Type 2 DM 2. Hypertension 3. Obesity 4. Schizophrenia 5. COPD/former smoker  Awaiting record of previous sleep study from [facility].  ECG, CXR, labs acceptable for surgery.  Kipp Brood, MD

## 2012-12-17 NOTE — Progress Notes (Signed)
Left message with Alycia Rossetti, RN at Dr. Zenaida Niece Trigt's office that pt has positive staph swab and needs Mupirocin ointment rx called into [facility] on [address] [place], [place]. Spoke with pt's daughter Verlon Au about results. She voiced understanding.

## 2012-12-17 NOTE — Telephone Encounter (Signed)
RX for Mupirocin 2% oint apply BID to both nostrils x 5 days was called to pt's Pharm. Pt notified

## 2012-12-18 ENCOUNTER — Ambulatory Visit
Admission: RE | Admit: 2012-12-18 | Discharge: 2012-12-18 | Disposition: A | Discharge status: Home / Self Care | Payer: Medicaid Other | Source: Ambulatory Visit | Attending: Cardiothoracic Surgery | Admitting: Cardiothoracic Surgery

## 2012-12-18 DIAGNOSIS — I313 Pericardial effusion (noninflammatory): Secondary | ICD-10-CM

## 2012-12-18 DIAGNOSIS — R0602 Shortness of breath: Secondary | ICD-10-CM

## 2012-12-18 LAB — QUANTIFERON TB GOLD ASSAY (BLOOD)
Interferon Gamma Release Assay: NEGATIVE
Mitogen value: 9.05 IU/mL
Quantiferon Nil Value: 0.03 IU/mL
TB Ag value: 0.03 IU/mL
TB Antigen Minus Nil Value: 0 IU/mL

## 2012-12-18 MED ORDER — CEFUROXIME SODIUM 1.5 G IJ SOLR
1.5000 g | INTRAMUSCULAR | Status: AC
  Administered 2012-12-19: 1.5 g via INTRAVENOUS
  Filled 2012-12-18: qty 1.5

## 2012-12-18 MED ORDER — IOHEXOL 300 MG/ML  SOLN
75.0000 mL | Freq: Once | INTRAMUSCULAR | Status: AC | PRN
  Administered 2012-12-18: 75 mL via INTRAVENOUS

## 2012-12-18 NOTE — Progress Notes (Signed)
No Sleep Study received from [place].

## 2012-12-19 ENCOUNTER — Encounter (HOSPITAL_COMMUNITY): Payer: Self-pay | Admitting: *Deleted

## 2012-12-19 ENCOUNTER — Encounter (HOSPITAL_COMMUNITY): Payer: Self-pay | Admitting: Anesthesiology

## 2012-12-19 ENCOUNTER — Inpatient Hospital Stay (HOSPITAL_COMMUNITY)
Admission: RE | Admit: 2012-12-19 | Discharge: 2012-12-24 | DRG: 238 | Disposition: A | Discharge status: Home Health | Payer: Medicaid Other | Source: Ambulatory Visit | Attending: Cardiothoracic Surgery | Admitting: Cardiothoracic Surgery

## 2012-12-19 ENCOUNTER — Encounter (HOSPITAL_COMMUNITY)
Admission: RE | Disposition: A | Discharge status: Home Health | Payer: Self-pay | Source: Ambulatory Visit | Attending: Cardiothoracic Surgery

## 2012-12-19 ENCOUNTER — Encounter (HOSPITAL_COMMUNITY): Payer: Self-pay | Admitting: Pharmacy Technician

## 2012-12-19 ENCOUNTER — Inpatient Hospital Stay (HOSPITAL_COMMUNITY): Payer: Medicaid Other

## 2012-12-19 ENCOUNTER — Ambulatory Visit (HOSPITAL_COMMUNITY): Payer: Medicaid Other | Admitting: Anesthesiology

## 2012-12-19 DIAGNOSIS — J4489 Other specified chronic obstructive pulmonary disease: Secondary | ICD-10-CM | POA: Diagnosis present

## 2012-12-19 DIAGNOSIS — E876 Hypokalemia: Secondary | ICD-10-CM | POA: Diagnosis not present

## 2012-12-19 DIAGNOSIS — R5381 Other malaise: Secondary | ICD-10-CM | POA: Diagnosis present

## 2012-12-19 DIAGNOSIS — G4730 Sleep apnea, unspecified: Secondary | ICD-10-CM | POA: Diagnosis present

## 2012-12-19 DIAGNOSIS — D62 Acute posthemorrhagic anemia: Secondary | ICD-10-CM | POA: Diagnosis not present

## 2012-12-19 DIAGNOSIS — J90 Pleural effusion, not elsewhere classified: Secondary | ICD-10-CM | POA: Diagnosis not present

## 2012-12-19 DIAGNOSIS — Z79899 Other long term (current) drug therapy: Secondary | ICD-10-CM

## 2012-12-19 DIAGNOSIS — Z6841 Body Mass Index (BMI) 40.0 and over, adult: Secondary | ICD-10-CM

## 2012-12-19 DIAGNOSIS — I309 Acute pericarditis, unspecified: Secondary | ICD-10-CM

## 2012-12-19 DIAGNOSIS — I319 Disease of pericardium, unspecified: Secondary | ICD-10-CM

## 2012-12-19 DIAGNOSIS — F209 Schizophrenia, unspecified: Secondary | ICD-10-CM | POA: Diagnosis present

## 2012-12-19 DIAGNOSIS — I10 Essential (primary) hypertension: Secondary | ICD-10-CM | POA: Diagnosis present

## 2012-12-19 DIAGNOSIS — Z87891 Personal history of nicotine dependence: Secondary | ICD-10-CM

## 2012-12-19 DIAGNOSIS — I313 Pericardial effusion (noninflammatory): Secondary | ICD-10-CM

## 2012-12-19 DIAGNOSIS — E119 Type 2 diabetes mellitus without complications: Secondary | ICD-10-CM | POA: Diagnosis present

## 2012-12-19 DIAGNOSIS — F411 Generalized anxiety disorder: Secondary | ICD-10-CM | POA: Diagnosis present

## 2012-12-19 DIAGNOSIS — J449 Chronic obstructive pulmonary disease, unspecified: Secondary | ICD-10-CM | POA: Diagnosis present

## 2012-12-19 DIAGNOSIS — R079 Chest pain, unspecified: Secondary | ICD-10-CM

## 2012-12-19 DIAGNOSIS — J9819 Other pulmonary collapse: Secondary | ICD-10-CM | POA: Diagnosis not present

## 2012-12-19 DIAGNOSIS — R0602 Shortness of breath: Secondary | ICD-10-CM

## 2012-12-19 HISTORY — PX: INTRAOPERATIVE TRANSESOPHAGEAL ECHOCARDIOGRAM: SHX5062

## 2012-12-19 HISTORY — PX: SUBXYPHOID PERICARDIAL WINDOW: SHX5075

## 2012-12-19 LAB — POCT I-STAT 3, ART BLOOD GAS (G3+)
Acid-base deficit: 4 mmol/L — ABNORMAL HIGH (ref 0.0–2.0)
Bicarbonate: 22.8 mEq/L (ref 20.0–24.0)
O2 Saturation: 94 %
Patient temperature: 97
TCO2: 24 mmol/L (ref 0–100)
pCO2 arterial: 48.7 mmHg — ABNORMAL HIGH (ref 35.0–45.0)
pH, Arterial: 7.275 — ABNORMAL LOW (ref 7.350–7.450)
pO2, Arterial: 75 mmHg — ABNORMAL LOW (ref 80.0–100.0)

## 2012-12-19 LAB — BLOOD GAS, ARTERIAL
Acid-base deficit: 3.9 mmol/L — ABNORMAL HIGH (ref 0.0–2.0)
Acid-base deficit: 3.8 mmol/L — ABNORMAL HIGH (ref 0.0–2.0)
Bicarbonate: 22 mEq/L (ref 20.0–24.0)
O2 Content: 2 L/min
O2 Saturation: 96.8 %
Patient temperature: 97.4
Patient temperature: 96.9
TCO2: 24.9 mmol/L (ref 0–100)
TCO2: 23.6 mmol/L (ref 0–100)
pCO2 arterial: 58.8 mmHg (ref 35.0–45.0)
pCO2 arterial: 47.8 mmHg — ABNORMAL HIGH (ref 35.0–45.0)
pH, Arterial: 7.28 — ABNORMAL LOW (ref 7.350–7.450)
pO2, Arterial: 106 mmHg — ABNORMAL HIGH (ref 80.0–100.0)

## 2012-12-19 LAB — GLUCOSE, CAPILLARY
Glucose-Capillary: 97 mg/dL (ref 70–99)
Glucose-Capillary: 75 mg/dL (ref 70–99)
Glucose-Capillary: 99 mg/dL (ref 70–99)
Glucose-Capillary: 147 mg/dL — ABNORMAL HIGH (ref 70–99)
Glucose-Capillary: 132 mg/dL — ABNORMAL HIGH (ref 70–99)

## 2012-12-19 SURGERY — CREATION, PERICARDIAL WINDOW, SUBXIPHOID APPROACH
Anesthesia: General | Wound class: Clean

## 2012-12-19 MED ORDER — ALBUTEROL SULFATE HFA 108 (90 BASE) MCG/ACT IN AERS
2.0000 | INHALATION_SPRAY | Freq: Four times a day (QID) | RESPIRATORY_TRACT | Status: DC | PRN
  Filled 2012-12-19: qty 6.7

## 2012-12-19 MED ORDER — LUBIPROSTONE 24 MCG PO CAPS
24.0000 ug | ORAL_CAPSULE | Freq: Two times a day (BID) | ORAL | Status: DC
Start: 2012-12-20 — End: 2012-12-24
  Administered 2012-12-20 – 2012-12-24 (×9): 24 ug via ORAL
  Filled 2012-12-19 (×12): qty 1

## 2012-12-19 MED ORDER — QUETIAPINE FUMARATE 400 MG PO TABS
800.0000 mg | ORAL_TABLET | Freq: Every day | ORAL | Status: DC
  Administered 2012-12-20 – 2012-12-23 (×4): 800 mg via ORAL
  Filled 2012-12-19 (×5): qty 2

## 2012-12-19 MED ORDER — DEXTROSE 5 % IV SOLN
1.5000 g | Freq: Two times a day (BID) | INTRAVENOUS | Status: DC
  Filled 2012-12-19: qty 1.5

## 2012-12-19 MED ORDER — VITAMIN D (ERGOCALCIFEROL) 1.25 MG (50000 UNIT) PO CAPS
50000.0000 [IU] | ORAL_CAPSULE | ORAL | Status: DC
  Administered 2012-12-20: 50000 [IU] via ORAL
  Filled 2012-12-19: qty 1

## 2012-12-19 MED ORDER — GLYCOPYRROLATE 0.2 MG/ML IJ SOLN
INTRAMUSCULAR | Status: DC | PRN
  Administered 2012-12-19: .8 mg via INTRAVENOUS

## 2012-12-19 MED ORDER — SUCCINYLCHOLINE CHLORIDE 20 MG/ML IJ SOLN
INTRAMUSCULAR | Status: DC | PRN
  Administered 2012-12-19: 120 mg via INTRAVENOUS

## 2012-12-19 MED ORDER — TOPIRAMATE 100 MG PO TABS
100.0000 mg | ORAL_TABLET | Freq: Two times a day (BID) | ORAL | Status: DC
  Administered 2012-12-20 – 2012-12-23 (×8): 100 mg via ORAL
  Filled 2012-12-19 (×11): qty 1

## 2012-12-19 MED ORDER — ROCURONIUM BROMIDE 100 MG/10ML IV SOLN
INTRAVENOUS | Status: DC | PRN
  Administered 2012-12-19: 35 mg via INTRAVENOUS

## 2012-12-19 MED ORDER — LACTATED RINGERS IV SOLN
INTRAVENOUS | Status: DC
  Administered 2012-12-19 (×2): via INTRAVENOUS

## 2012-12-19 MED ORDER — BISACODYL 5 MG PO TBEC
10.0000 mg | DELAYED_RELEASE_TABLET | Freq: Every day | ORAL | Status: DC
  Administered 2012-12-20 – 2012-12-24 (×5): 10 mg via ORAL
  Filled 2012-12-19 (×5): qty 2

## 2012-12-19 MED ORDER — MUPIROCIN 2 % EX OINT
TOPICAL_OINTMENT | Freq: Two times a day (BID) | CUTANEOUS | Status: DC
  Administered 2012-12-19: 23:00:00 via TOPICAL
  Administered 2012-12-20: 1 via TOPICAL
  Administered 2012-12-20 – 2012-12-24 (×8): via TOPICAL
  Filled 2012-12-19: qty 22

## 2012-12-19 MED ORDER — DEXTROSE 5 % IV SOLN
1.5000 g | Freq: Two times a day (BID) | INTRAVENOUS | Status: AC
  Administered 2012-12-19 – 2012-12-20 (×2): 1.5 g via INTRAVENOUS
  Filled 2012-12-19 (×2): qty 1.5

## 2012-12-19 MED ORDER — VITAMIN D3 1.25 MG (50000 UT) PO CAPS: 50000.0000 [IU] | ORAL_CAPSULE | ORAL | Status: DC

## 2012-12-19 MED ORDER — LIRAGLUTIDE 18 MG/3ML ~~LOC~~ SOPN
1.8000 mg | PEN_INJECTOR | Freq: Every day | SUBCUTANEOUS | Status: DC
  Administered 2012-12-20 – 2012-12-21 (×2): 1.8 mg via SUBCUTANEOUS
  Administered 2012-12-22: 10:00:00 via SUBCUTANEOUS
  Administered 2012-12-23 – 2012-12-24 (×2): 1.8 mg via SUBCUTANEOUS

## 2012-12-19 MED ORDER — NEOSTIGMINE METHYLSULFATE 1 MG/ML IJ SOLN
INTRAMUSCULAR | Status: DC | PRN
  Administered 2012-12-19: 5 mg via INTRAVENOUS

## 2012-12-19 MED ORDER — TIOTROPIUM BROMIDE MONOHYDRATE 18 MCG IN CAPS
18.0000 ug | ORAL_CAPSULE | Freq: Every day | RESPIRATORY_TRACT | Status: DC
  Administered 2012-12-20 – 2012-12-23 (×4): 18 ug via RESPIRATORY_TRACT
  Filled 2012-12-19: qty 5

## 2012-12-19 MED ORDER — MIDAZOLAM HCL 5 MG/5ML IJ SOLN
INTRAMUSCULAR | Status: DC | PRN
  Administered 2012-12-19: 2 mg via INTRAVENOUS

## 2012-12-19 MED ORDER — LACTATED RINGERS IV SOLN
INTRAVENOUS | Status: DC | PRN
  Administered 2012-12-19: 15:00:00 via INTRAVENOUS

## 2012-12-19 MED ORDER — SIMVASTATIN 10 MG PO TABS
10.0000 mg | ORAL_TABLET | Freq: Every day | ORAL | Status: DC
  Administered 2012-12-20 – 2012-12-23 (×3): 10 mg via ORAL
  Filled 2012-12-19 (×7): qty 1

## 2012-12-19 MED ORDER — METOPROLOL TARTRATE 12.5 MG HALF TABLET
12.5000 mg | ORAL_TABLET | Freq: Two times a day (BID) | ORAL | Status: DC
  Administered 2012-12-20 (×2): 12.5 mg via ORAL
  Filled 2012-12-19 (×5): qty 1

## 2012-12-19 MED ORDER — CALCITRIOL 0.25 MCG PO CAPS
0.2500 ug | ORAL_CAPSULE | ORAL | Status: DC
  Administered 2012-12-22: 0.25 ug via ORAL
  Filled 2012-12-19 (×4): qty 1

## 2012-12-19 MED ORDER — HYDROMORPHONE HCL PF 1 MG/ML IJ SOLN: 0.2500 mg | INTRAMUSCULAR | Status: DC | PRN

## 2012-12-19 MED ORDER — HYDROCODONE-ACETAMINOPHEN 5-325 MG PO TABS
1.0000 | ORAL_TABLET | ORAL | Status: DC | PRN
  Administered 2012-12-22 – 2012-12-24 (×4): 1 via ORAL
  Filled 2012-12-19 (×4): qty 1

## 2012-12-19 MED ORDER — ONDANSETRON HCL 4 MG/2ML IJ SOLN
INTRAMUSCULAR | Status: DC | PRN
  Administered 2012-12-19: 4 mg via INTRAVENOUS

## 2012-12-19 MED ORDER — MIDAZOLAM HCL 2 MG/2ML IJ SOLN
INTRAMUSCULAR | Status: AC
  Administered 2012-12-19: 1 mg via INTRAVENOUS
  Filled 2012-12-19: qty 2

## 2012-12-19 MED ORDER — POTASSIUM CHLORIDE 10 MEQ/50ML IV SOLN
10.0000 meq | Freq: Every day | INTRAVENOUS | Status: DC | PRN
  Administered 2012-12-22 (×3): 10 meq via INTRAVENOUS
  Filled 2012-12-19: qty 50
  Filled 2012-12-19 (×2): qty 150

## 2012-12-19 MED ORDER — ZIPRASIDONE HCL 80 MG PO CAPS
80.0000 mg | ORAL_CAPSULE | Freq: Two times a day (BID) | ORAL | Status: DC
  Administered 2012-12-20 – 2012-12-23 (×8): 80 mg via ORAL
  Filled 2012-12-19 (×12): qty 1

## 2012-12-19 MED ORDER — FENTANYL CITRATE 0.05 MG/ML IJ SOLN
25.0000 ug | INTRAMUSCULAR | Status: DC | PRN
  Administered 2012-12-19 – 2012-12-21 (×6): 25 ug via INTRAVENOUS
  Filled 2012-12-19 (×5): qty 2

## 2012-12-19 MED ORDER — FENTANYL CITRATE 0.05 MG/ML IJ SOLN
INTRAMUSCULAR | Status: DC | PRN
  Administered 2012-12-19 (×2): 100 ug via INTRAVENOUS

## 2012-12-19 MED ORDER — LIDOCAINE HCL (CARDIAC) 20 MG/ML IV SOLN
INTRAVENOUS | Status: DC | PRN
  Administered 2012-12-19: 50 mg via INTRAVENOUS

## 2012-12-19 MED ORDER — FENTANYL CITRATE 0.05 MG/ML IJ SOLN
50.0000 ug | INTRAMUSCULAR | Status: DC | PRN
Start: 2012-12-19 — End: 2012-12-19
  Administered 2012-12-19: 50 ug via INTRAVENOUS

## 2012-12-19 MED ORDER — ETOMIDATE 2 MG/ML IV SOLN
INTRAVENOUS | Status: DC | PRN
  Administered 2012-12-19: 12 mg via INTRAVENOUS

## 2012-12-19 MED ORDER — MIDAZOLAM HCL 2 MG/2ML IJ SOLN
1.0000 mg | INTRAMUSCULAR | Status: DC | PRN
  Administered 2012-12-19: 1 mg via INTRAVENOUS

## 2012-12-19 MED ORDER — ACETAMINOPHEN 160 MG/5ML PO SOLN
1000.0000 mg | Freq: Four times a day (QID) | ORAL | Status: AC
  Administered 2012-12-20: 1000 mg via ORAL
  Filled 2012-12-19: qty 40.6

## 2012-12-19 MED ORDER — INSULIN ASPART 100 UNIT/ML ~~LOC~~ SOLN
0.0000 [IU] | Freq: Four times a day (QID) | SUBCUTANEOUS | Status: DC
  Administered 2012-12-19: 2 [IU] via SUBCUTANEOUS
  Administered 2012-12-20: 8 [IU] via SUBCUTANEOUS
  Administered 2012-12-21: 2 [IU] via SUBCUTANEOUS
  Administered 2012-12-21: 4 [IU] via SUBCUTANEOUS
  Administered 2012-12-22 (×4): 2 [IU] via SUBCUTANEOUS

## 2012-12-19 MED ORDER — 0.9 % SODIUM CHLORIDE (POUR BTL) OPTIME
TOPICAL | Status: DC | PRN
  Administered 2012-12-19: 1000 mL

## 2012-12-19 MED ORDER — TRAZODONE HCL 150 MG PO TABS
300.0000 mg | ORAL_TABLET | Freq: Every day | ORAL | Status: DC
  Administered 2012-12-20 – 2012-12-23 (×4): 300 mg via ORAL
  Filled 2012-12-19 (×5): qty 2

## 2012-12-19 MED ORDER — FENTANYL CITRATE 0.05 MG/ML IJ SOLN
INTRAMUSCULAR | Status: AC
  Administered 2012-12-19: 25 ug
  Filled 2012-12-19: qty 2

## 2012-12-19 MED ORDER — ACETAMINOPHEN 500 MG PO TABS
1000.0000 mg | ORAL_TABLET | Freq: Four times a day (QID) | ORAL | Status: AC
  Administered 2012-12-20: 1000 mg via ORAL
  Filled 2012-12-19: qty 2

## 2012-12-19 MED ORDER — ALBUMIN HUMAN 5 % IV SOLN
INTRAVENOUS | Status: DC | PRN
  Administered 2012-12-19: 16:00:00 via INTRAVENOUS

## 2012-12-19 MED ORDER — MOMETASONE FURO-FORMOTEROL FUM 100-5 MCG/ACT IN AERO
2.0000 | INHALATION_SPRAY | Freq: Two times a day (BID) | RESPIRATORY_TRACT | Status: DC
  Administered 2012-12-20 – 2012-12-24 (×8): 2 via RESPIRATORY_TRACT
  Filled 2012-12-19: qty 8.8

## 2012-12-19 MED ORDER — OXYCODONE-ACETAMINOPHEN 5-325 MG PO TABS
1.0000 | ORAL_TABLET | ORAL | Status: DC | PRN
  Administered 2012-12-21 (×2): 1 via ORAL
  Filled 2012-12-19 (×2): qty 1

## 2012-12-19 MED ORDER — LEVOTHYROXINE SODIUM 137 MCG PO TABS
137.0000 ug | ORAL_TABLET | Freq: Every day | ORAL | Status: DC
  Administered 2012-12-20 – 2012-12-24 (×5): 137 ug via ORAL
  Filled 2012-12-19 (×6): qty 1

## 2012-12-19 MED ORDER — SENNOSIDES-DOCUSATE SODIUM 8.6-50 MG PO TABS
1.0000 | ORAL_TABLET | Freq: Every evening | ORAL | Status: DC | PRN
  Filled 2012-12-19: qty 1

## 2012-12-19 MED ORDER — ARTIFICIAL TEARS OP OINT
TOPICAL_OINTMENT | OPHTHALMIC | Status: DC | PRN
  Administered 2012-12-19: 1 via OPHTHALMIC

## 2012-12-19 MED ORDER — ONDANSETRON HCL 4 MG/2ML IJ SOLN
4.0000 mg | Freq: Four times a day (QID) | INTRAMUSCULAR | Status: DC | PRN
  Filled 2012-12-19: qty 2

## 2012-12-19 MED ORDER — SODIUM CHLORIDE 0.9 % IV SOLN
INTRAVENOUS | Status: DC | PRN
  Administered 2012-12-19: 16:00:00 via INTRAVENOUS

## 2012-12-19 MED ORDER — CLONAZEPAM 1 MG PO TABS
2.0000 mg | ORAL_TABLET | Freq: Every day | ORAL | Status: DC
  Administered 2012-12-20 – 2012-12-23 (×4): 2 mg via ORAL
  Filled 2012-12-19 (×4): qty 2

## 2012-12-19 MED ORDER — FENTANYL CITRATE 0.05 MG/ML IJ SOLN
INTRAMUSCULAR | Status: AC
  Administered 2012-12-19: 50 ug via INTRAVENOUS
  Filled 2012-12-19: qty 2

## 2012-12-19 SURGICAL SUPPLY — 50 items
ATTRACTOMAT 16X20 MAGNETIC DRP (DRAPES) IMPLANT
BENZOIN TINCTURE PRP APPL 2/3 (GAUZE/BANDAGES/DRESSINGS) IMPLANT
CANISTER SUCTION 2500CC (MISCELLANEOUS) ×3 IMPLANT
CATH THORACIC 28FR (CATHETERS) IMPLANT
CATH THORACIC 28FR RT ANG (CATHETERS) IMPLANT
CATH THORACIC 36FR (CATHETERS) IMPLANT
CATH THORACIC 36FR RT ANG (CATHETERS) IMPLANT
CLOSURE WOUND 1/2 X4 (GAUZE/BANDAGES/DRESSINGS)
CLOTH BEACON ORANGE TIMEOUT ST (SAFETY) ×3 IMPLANT
CONN ST 1/4X3/8  BEN (MISCELLANEOUS) ×2
CONN ST 1/4X3/8 BEN (MISCELLANEOUS) ×1 IMPLANT
CONT SPEC 4OZ CLIKSEAL STRL BL (MISCELLANEOUS) ×6 IMPLANT
COVER SURGICAL LIGHT HANDLE (MISCELLANEOUS) ×3 IMPLANT
DRAIN CHANNEL 28F RND 3/8 FF (WOUND CARE) ×3 IMPLANT
DRAPE LAPAROSCOPIC ABDOMINAL (DRAPES) ×3 IMPLANT
DRAPE PROXIMA HALF (DRAPES) ×3 IMPLANT
DRSG AQUACEL AG ADV 3.5X14 (GAUZE/BANDAGES/DRESSINGS) IMPLANT
ELECT REM PT RETURN 9FT ADLT (ELECTROSURGICAL) ×3
ELECTRODE REM PT RTRN 9FT ADLT (ELECTROSURGICAL) ×1 IMPLANT
GLOVE BIO SURGEON STRL SZ7 (GLOVE) ×3 IMPLANT
GLOVE BIO SURGEON STRL SZ7.5 (GLOVE) ×6 IMPLANT
HEMOSTAT POWDER SURGIFOAM 1G (HEMOSTASIS) ×3 IMPLANT
KIT BASIN OR (CUSTOM PROCEDURE TRAY) ×3 IMPLANT
KIT ROOM TURNOVER OR (KITS) ×3 IMPLANT
NS IRRIG 1000ML POUR BTL (IV SOLUTION) ×3 IMPLANT
PACK CHEST (CUSTOM PROCEDURE TRAY) ×3 IMPLANT
PAD ARMBOARD 7.5X6 YLW CONV (MISCELLANEOUS) ×6 IMPLANT
PAD ELECT DEFIB RADIOL ZOLL (MISCELLANEOUS) ×3 IMPLANT
SPONGE GAUZE 4X4 12PLY (GAUZE/BANDAGES/DRESSINGS) ×3 IMPLANT
STRIP CLOSURE SKIN 1/2X4 (GAUZE/BANDAGES/DRESSINGS) IMPLANT
SUT SILK  1 MH (SUTURE) ×2
SUT SILK 1 MH (SUTURE) ×1 IMPLANT
SUT SILK 2 0 SH CR/8 (SUTURE) ×3 IMPLANT
SUT VIC AB 1 CTX 18 (SUTURE) ×3 IMPLANT
SUT VIC AB 1 CTX 36 (SUTURE)
SUT VIC AB 1 CTX36XBRD ANBCTR (SUTURE) IMPLANT
SUT VIC AB 2-0 CT1 18 (SUTURE) ×3 IMPLANT
SUT VIC AB 3-0 X1 27 (SUTURE) ×3 IMPLANT
SWAB COLLECTION DEVICE MRSA (MISCELLANEOUS) IMPLANT
SYR 50ML SLIP (SYRINGE) IMPLANT
SYRINGE 10CC LL (SYRINGE) IMPLANT
SYSTEM SAHARA CHEST DRAIN ATS (WOUND CARE) IMPLANT
TAPE CLOTH SURG 4X10 WHT LF (GAUZE/BANDAGES/DRESSINGS) ×3 IMPLANT
TOWEL OR 17X24 6PK STRL BLUE (TOWEL DISPOSABLE) ×3 IMPLANT
TOWEL OR 17X26 10 PK STRL BLUE (TOWEL DISPOSABLE) ×6 IMPLANT
TRAP SPECIMEN MUCOUS 40CC (MISCELLANEOUS) ×9 IMPLANT
TRAY FOLEY CATH 14FRSI W/METER (CATHETERS) ×3 IMPLANT
TRAY FOLEY IC TEMP SENS 14FR (CATHETERS) IMPLANT
TUBE ANAEROBIC SPECIMEN COL (MISCELLANEOUS) IMPLANT
WATER STERILE IRR 1000ML POUR (IV SOLUTION) ×6 IMPLANT

## 2012-12-19 NOTE — Progress Notes (Signed)
Echocardiogram 2D Echocardiogram Limited study has been performed.  Heather Compton 12/19/2012, 3:55 PM

## 2012-12-19 NOTE — Anesthesia Postprocedure Evaluation (Signed)
  Anesthesia Post-op Note  Patient: Heather Compton  Procedure(s) Performed: Procedure(s): SUBXYPHOID PERICARDIAL WINDOW (N/A) INTRAOPERATIVE TRANSESOPHAGEAL ECHOCARDIOGRAM (N/A)  Patient Location: PACU  Anesthesia Type:General  Level of Consciousness: awake  Airway and Oxygen Therapy: Patient Spontanous Breathing  Post-op Pain: mild  Post-op Assessment: Post-op Vital signs reviewed  Post-op Vital Signs: Reviewed  Complications: No apparent anesthesia complications

## 2012-12-19 NOTE — Anesthesia Preprocedure Evaluation (Addendum)
Anesthesia Evaluation  Patient identified by MRN, date of birth, ID band Patient awake    Reviewed: Allergy & Precautions, H&P , NPO status , Patient's Chart, lab work & pertinent test results  Airway Mallampati: II      Dental   Pulmonary shortness of breath and with exertion, asthma , sleep apnea , pneumonia -, COPD breath sounds clear to auscultation        Cardiovascular + Valvular Problems/Murmurs Rhythm:Regular Rate:Normal  Cardiac history noted. CE   Neuro/Psych Anxiety    GI/Hepatic Neg liver ROS,   Endo/Other  diabetes  Renal/GU negative Renal ROS     Musculoskeletal   Abdominal   Peds  Hematology   Anesthesia Other Findings   Reproductive/Obstetrics                          Anesthesia Physical Anesthesia Plan  ASA: IV  Anesthesia Plan: General   Post-op Pain Management:    Induction: Intravenous  Airway Management Planned: Oral ETT  Additional Equipment: Arterial line  Intra-op Plan:   Post-operative Plan: Possible Post-op intubation/ventilation  Informed Consent:   Dental advisory given  Plan Discussed with: CRNA, Anesthesiologist and Surgeon  Anesthesia Plan Comments:        Anesthesia Quick Evaluation

## 2012-12-19 NOTE — Anesthesia Procedure Notes (Signed)
Procedures RIJ CVP Dual Lumen 1350-1405: The patient was identified and consent obtained.  TO was performed, and full barrier precautions were used.  The skin was anesthetized with lidocaine.  Once the vein was located with the 22 ga. needle using ultrasound guidance , the wire was inserted into the vein.  The wire location was confirmed with ultrasound.  The tissue was dilated and the catheter was carefully inserted, then sutured in place. A dressing was applied. The patient tolerated the procedure well.   CE

## 2012-12-19 NOTE — Progress Notes (Signed)
The patient was examined and preop studies reviewed. There has been no change from the prior exam and the patient is ready for surgery.   Plan subxyphoid pericardial window on [place] for pericardial effusion, still present on 2D echo Today

## 2012-12-19 NOTE — Op Note (Signed)
Heather Compton, Heather Compton NO.:  [id number]  MEDICAL RECORD NO.:  [id number]  LOCATION:  2S06C                        FACILITY:  [facility]  PHYSICIAN:  Kerin Perna, M.D.  DATE OF BIRTH:  February 17, 1946  DATE OF PROCEDURE:  12/19/2012 DATE OF DISCHARGE:                              OPERATIVE REPORT   OPERATION:  Subxiphoid pericardial window.  PREOPERATIVE DIAGNOSIS:  Pericarditis with pericardial effusion.  POSTOPERATIVE DIAGNOSIS:  Pericarditis with pericardial effusion.  SURGEON:  Kerin Perna, M.D.  ASSISTANT:  Rowe Clack, PA-C.  ANESTHESIA:  General by Dr. Judie Petit with intraoperative transesophageal echocardiogram.  INDICATIONS:  The patient is a 67 year old obese, nonsmoker female from [place] with schizoaffective disorder, and being followed by [facility] in [place] for pericarditis and persistent pericardial effusion.  Because medical measures did not improve the effusion, she was felt to be a candidate for subxiphoid pericardial window.  Prior to surgery, I examined the patient in the office and reviewed results of the echocardiogram with the patient and her husband through an interpreter named Marchelle Folks provided by [employer].  I discussed the indications, benefits, use of general anesthesia, location of surgical incision, and expected recovery.  I discussed the risks of bleeding, infection, and respiratory-pulmonary problems due to her morbid obesity.  She understood and agreed to proceed with surgery under informed consent.  OPERATIVE FINDINGS: 1. 250 mL of crystal clear fluid in the pericardial space. 2. Pericardial tissue was not inflamed or thickened. 3. No epicarditis was noted or nodularity on the epicardium. 4. No blood clots or fibrinous stranding in the pericardial space.  DESCRIPTION OF PROCEDURE:  The patient was brought to the operating room and placed supine on the operating table, general  anesthesia was induced.  A proper time-out was performed after the patient was prepped and draped as a sterile field.  A small 2-3 inch incision was made centered on the xiphoid.  Dissection was carried down through the deep layer of fat.  The xiphoid was disarticulated and removed gently.  The distal sternum was elevated with a sternal elevating retractor and soft tissue anterior to the pericardium was dissected away.  The pericardium was incised with a #15 blade and there was immediate spurt of clear fluid under pressure.  The incision was extended with the surgical scissors leaving a large window measuring 2.5 x 2.5 cm.  The tissue was submitted for Pathology and cultures.  Fluid was drained completely. There was no loculated pockets of fluid seen.  The echocardiogram showed minimal residual effusion.  A 28-French soft pericardial drain was placed and brought out through separate incisions and secured to the skin and connected to an underwater seal Pleur-Evac system.  The sternal elevating retractor was removed.  The fascia was closed with interrupted #1 Vicryl.  The subcutaneous and skin layers were closed in running Vicryl and sterile dressings were applied.  The patient was extubated in the recovery room, observed, and then transferred to the recovery room.     Kerin Perna, M.D.     PV/MEDQ  D:  12/19/2012  T:  12/19/2012  Job:  [id number]  cc:   Lorine Bears, MD

## 2012-12-19 NOTE — Brief Op Note (Addendum)
      [address]       [place] [address]             [phone]     12/19/2012  4:44 PM  PATIENT:  Heather Compton  67 y.o. female  PRE-OPERATIVE DIAGNOSIS:  pericardial effusion  POST-OPERATIVE DIAGNOSIS:  pericardial effusion  PROCEDURE:  Procedure(s): SUBXYPHOID PERICARDIAL WINDOW INTRAOPERATIVE TRANSESOPHAGEAL ECHOCARDIOGRAM  SURGEON:  Surgeon(s): Kerin Perna, MD  PHYSICIAN ASSISTANT: WAYNE GOLD PA-C  ANESTHESIA:   general  PATIENT CONDITION:  PACU - hemodynamically stable.  PRE-OPERATIVE WEIGHT: 107kg  COMPLICATIONS: NO KNOWN  FINDINGS: 200CC CLEAR FLUID, NO EPICARDIAL INFLAMMATION  SPECIMEN: FLUID FOR CULTURE/CYTOLOGY; PERICARDIAL TISSUE FOR PATHOLOGY   Clinical Impression No evidence of malignancy or infection Etiology prob muld viral percarditis

## 2012-12-19 NOTE — Transfer of Care (Signed)
Immediate Anesthesia Transfer of Care Note  Patient: Heather Compton  Procedure(s) Performed: Procedure(s): SUBXYPHOID PERICARDIAL WINDOW (N/A) INTRAOPERATIVE TRANSESOPHAGEAL ECHOCARDIOGRAM (N/A)  Patient Location: PACU  Anesthesia Type:General  Level of Consciousness: awake, alert  and oriented  Airway & Oxygen Therapy: Patient Spontanous Breathing and Patient connected to face mask oxygen  Post-op Assessment: Report given to PACU RN  Post vital signs: Reviewed and stable  Complications: No apparent anesthesia complications

## 2012-12-19 NOTE — Preoperative (Signed)
Beta Blockers   Reason not to administer Beta Blockers:Not Applicable 

## 2012-12-20 ENCOUNTER — Inpatient Hospital Stay (HOSPITAL_COMMUNITY): Payer: Medicaid Other

## 2012-12-20 LAB — POCT I-STAT 3, ART BLOOD GAS (G3+)
Acid-base deficit: 4 mmol/L — ABNORMAL HIGH (ref 0.0–2.0)
Acid-base deficit: 3 mmol/L — ABNORMAL HIGH (ref 0.0–2.0)
Bicarbonate: 23.5 mEq/L (ref 20.0–24.0)
Bicarbonate: 23.3 mEq/L (ref 20.0–24.0)
O2 Saturation: 94 %
O2 Saturation: 98 %
Patient temperature: 99
Patient temperature: 99
TCO2: 25 mmol/L (ref 0–100)
TCO2: 25 mmol/L (ref 0–100)
pCO2 arterial: 54.3 mmHg — ABNORMAL HIGH (ref 35.0–45.0)
pCO2 arterial: 47.2 mmHg — ABNORMAL HIGH (ref 35.0–45.0)
pH, Arterial: 7.247 — ABNORMAL LOW (ref 7.350–7.450)
pH, Arterial: 7.302 — ABNORMAL LOW (ref 7.350–7.450)
pO2, Arterial: 87 mmHg (ref 80.0–100.0)
pO2, Arterial: 114 mmHg — ABNORMAL HIGH (ref 80.0–100.0)

## 2012-12-20 LAB — CBC
MCH: 32.2 pg (ref 26.0–34.0)
MCHC: 33.2 g/dL (ref 30.0–36.0)
MCV: 96.8 fL (ref 78.0–100.0)
Platelets: 212 10*3/uL (ref 150–400)
RDW: 13.7 % (ref 11.5–15.5)
WBC: 8.2 10*3/uL (ref 4.0–10.5)

## 2012-12-20 LAB — GLUCOSE, CAPILLARY
Glucose-Capillary: 101 mg/dL — ABNORMAL HIGH (ref 70–99)
Glucose-Capillary: 116 mg/dL — ABNORMAL HIGH (ref 70–99)
Glucose-Capillary: 127 mg/dL — ABNORMAL HIGH (ref 70–99)
Glucose-Capillary: 141 mg/dL — ABNORMAL HIGH (ref 70–99)
Glucose-Capillary: 168 mg/dL — ABNORMAL HIGH (ref 70–99)
Glucose-Capillary: 210 mg/dL — ABNORMAL HIGH (ref 70–99)
Glucose-Capillary: 79 mg/dL (ref 70–99)
Glucose-Capillary: 90 mg/dL (ref 70–99)

## 2012-12-20 LAB — BASIC METABOLIC PANEL
Calcium: 8.6 mg/dL (ref 8.4–10.5)
Chloride: 107 mEq/L (ref 96–112)
Creatinine, Ser: 0.74 mg/dL (ref 0.50–1.10)
GFR calc Af Amer: >90 mL/min (ref >90)
GFR calc non Af Amer: 88 mL/min — ABNORMAL LOW (ref >90)

## 2012-12-20 MED ORDER — POTASSIUM CHLORIDE 10 MEQ/50ML IV SOLN
10.0000 meq | INTRAVENOUS | Status: AC
  Administered 2012-12-20 (×3): 10 meq via INTRAVENOUS

## 2012-12-20 MED ORDER — SODIUM CHLORIDE 0.9 % IJ SOLN
10.0000 mL | INTRAMUSCULAR | Status: DC | PRN
  Administered 2012-12-22: 20 mL

## 2012-12-20 MED ORDER — BENEPROTEIN PO POWD
2.0000 | Freq: Three times a day (TID) | ORAL | Status: DC
  Administered 2012-12-20 – 2012-12-24 (×11): 12 g via ORAL
  Filled 2012-12-20: qty 227

## 2012-12-20 MED ORDER — FUROSEMIDE 10 MG/ML IJ SOLN
40.0000 mg | Freq: Once | INTRAMUSCULAR | Status: AC
  Administered 2012-12-20: 40 mg via INTRAVENOUS
  Filled 2012-12-20: qty 4

## 2012-12-20 MED ORDER — LACTATED RINGERS IV SOLN: INTRAVENOUS | Status: DC

## 2012-12-20 MED ORDER — ADULT MULTIVITAMIN W/MINERALS CH
1.0000 | ORAL_TABLET | Freq: Every day | ORAL | Status: DC
  Administered 2012-12-20 – 2012-12-24 (×5): 1 via ORAL
  Filled 2012-12-20 (×6): qty 1

## 2012-12-20 MED ORDER — CHLORHEXIDINE GLUCONATE CLOTH 2 % EX PADS
6.0000 | MEDICATED_PAD | Freq: Every day | CUTANEOUS | Status: DC
  Administered 2012-12-20 – 2012-12-23 (×4): 6 via TOPICAL

## 2012-12-20 MED ORDER — SODIUM CHLORIDE 0.9 % IJ SOLN
10.0000 mL | Freq: Two times a day (BID) | INTRAMUSCULAR | Status: DC
  Administered 2012-12-20 (×2): 10 mL
  Administered 2012-12-21: 20 mL
  Administered 2012-12-21: 10 mL

## 2012-12-20 NOTE — Progress Notes (Signed)
INITIAL NUTRITION ASSESSMENT  DOCUMENTATION CODES Per approved criteria  -Morbid Obesity   INTERVENTION: Provide Beneprotein TID with meals until diet is advanced Provide Multivitamin with mineral daily Recommend SLP consult due to pt's report of choking on liquids  NUTRITION DIAGNOSIS: Inadequate energy/protein intake related to restricted diet as evidenced by pt on clear liquids.   Goal: Pt to meet >/= 90% of their estimated nutrition needs   Monitor:  Diet advancement PO intake Weight Labs  Reason for Assessment: Malnutrition Screening Tool, score of 2  67 y.o. female  Admitting Dx: Pericarditis  ASSESSMENT: 67 year old obese, nonsmoker female from  [place] with schizoaffective disorder, and being followed by [facility] in [place] for pericarditis and persistent pericardial effusion. Because medical measures did not improve the effusion, she was felt to be a candidate for subxiphoid pericardial window.  Pt reports she hasn't eaten since Thursday. She is on a clear liquid diet had two juices and jello for breakfast per RN. Pt reports that she has had difficulty swallowing and states she chokes on liquids. She reports that her usual body weight is 236 lbs and that prior to Thursday, she was eating well.   Height: Ht Readings from Last 1 Encounters:  12/20/12 5' 2.5" (1.588 m)    Weight: Wt Readings from Last 1 Encounters:  12/20/12 236 lb 1.6 oz (107.094 kg)    Ideal Body Weight: 112 lbs  % Ideal Body Weight: 210 lbs  Wt Readings from Last 10 Encounters:  12/20/12 236 lb 1.6 oz (107.094 kg)  12/20/12 236 lb 1.6 oz (107.094 kg)  12/16/12 236 lb 1.6 oz (107.094 kg)  12/11/12 235 lb (106.595 kg)  12/02/12 235 lb (106.595 kg)  10/30/12 234 lb 12 oz (106.482 kg)    Usual Body Weight: 236 lbs  % Usual Body Weight: 100%  BMI:  Body mass index is 42.47 kg/(m^2).  Estimated Nutritional Needs: Kcal: 1900-2100 Protein: 110-120 grams Fluid: 2.8  L/day  Skin: generalized edema; chest incision  Diet Order: Clear Liquid  EDUCATION NEEDS: -No education needs identified at this time   Intake/Output Summary (Last 24 hours) at 12/20/12 1423 Last data filed at 12/20/12 1300  Gross per 24 hour  Intake   2970 ml  Output   3515 ml  Net   -545 ml    Last BM: PTA  Labs:   Recent Labs Lab 12/16/12 1553 12/20/12 0415  NA 141 139  K 4.2 3.7  CL 107 107  CO2 22 23  BUN 20 13  CREATININE 1.09 0.74  CALCIUM 8.8 8.6  GLUCOSE 112* 127*    CBG (last 3)   Recent Labs  12/20/12 0333 12/20/12 0802 12/20/12 1133  GLUCAP 127* 101* 210*    Scheduled Meds: . acetaminophen  1,000 mg Oral Q6H   Or  . acetaminophen (TYLENOL) oral liquid 160 mg/5 mL  1,000 mg Oral Q6H  . bisacodyl  10 mg Oral Daily  . calcitRIOL  0.25 mcg Oral 3 times weekly  . clonazePAM  2 mg Oral QHS  . insulin aspart  0-24 Units Subcutaneous Q6H  . levothyroxine  137 mcg Oral QAC breakfast  . Liraglutide  1.8 mg Subcutaneous Daily  . lubiprostone  24 mcg Oral BID WC  . metoprolol tartrate  12.5 mg Oral BID  . mometasone-formoterol  2 puff Inhalation BID  . mupirocin ointment   Topical BID  . QUEtiapine  800 mg Oral QHS  . simvastatin  10 mg Oral QHS  .  sodium chloride  10-40 mL Intracatheter Q12H  . tiotropium  18 mcg Inhalation Daily  . topiramate  100 mg Oral BID  . traZODone  300 mg Oral QHS  . Vitamin D (Ergocalciferol)  50,000 Units Oral Q14 Days  . ziprasidone  80 mg Oral BID WC    Continuous Infusions: . lactated ringers 20 mL/hr at 12/20/12 1300    Past Medical History  Diagnosis Date  . Enlarged heart   . Hypothyroidism   . Asthma   . History of shingles   . Cellulitis   . Diabetes mellitus without complication   . History of fall   . Bronchitis   . Cancer of cervix   . Schizoaffective disorder   . COPD (chronic obstructive pulmonary disease)   . Complication of anesthesia     hard to wake up  . Chest pain     with  exertion  . Heart murmur   . Pneumonia     numerous times in Jan. 2013  . Varicose veins   . Cramps, extremity   . Anemia   . Cataracts, bilateral   . Sleep apnea     Past Surgical History  Procedure Laterality Date  . Gallbladder surgery    . Gastric bypass    . Cervical surgery    . Breast lumpectomy    . Cesarean section    . Cholecystectomy    . Tubal ligation      Ian Malkin RD, LDN Inpatient Clinical Dietitian Pager: [phone] After Hours Pager: [phone]

## 2012-12-20 NOTE — Plan of Care (Signed)
Problem: Phase II Progression Outcomes Goal: Tolerates weaning with O2 Sat > 90 Outcome: Progressing Down to 2 L. Obtaining 500 ml on IS Goal: CBGs/Blood glucose < or equal to 120 Outcome: Progressing Had 1 CBG 210. The rest were less than 120.

## 2012-12-20 NOTE — Progress Notes (Signed)
1 Day Post-Op Procedure(s) (LRB): SUBXYPHOID PERICARDIAL WINDOW (N/A) INTRAOPERATIVE TRANSESOPHAGEAL ECHOCARDIOGRAM (N/A) Subjective: No complaints  Objective: Vital signs in last 24 hours: Temp:  [97 F (36.1 C)-98.3 F (36.8 C)] 98 F (36.7 C) (09/06 1134) Pulse Rate:  [56-77] 64 (09/06 1100) Cardiac Rhythm:  [-] Normal sinus rhythm (09/06 0800) Resp:  [14-22] 19 (09/06 1100) BP: (110-137)/(43-68) 113/43 mmHg (09/06 1100) SpO2:  [96 %-100 %] 100 % (09/06 1100) Arterial Line BP: (106-173)/(44-72) 150/59 mmHg (09/06 1100) Weight:  [107.094 kg (236 lb 1.6 oz)] 107.094 kg (236 lb 1.6 oz) (09/06 0947)  Hemodynamic parameters for last 24 hours:    Intake/Output from previous day: 09/05 0701 - 09/06 0700 In: 2770 [P.O.:100; I.V.:2270; IV Piggyback:400] Out: 2900 [Urine:2525; Blood:100; Chest Tube:275] Intake/Output this shift: Total I/O In: 140 [P.O.:100; I.V.:40] Out: 390 [Urine:330; Chest Tube:60]  General appearance: cooperative Heart: regular rate and rhythm, S1, S2 normal, no murmur, click, rub or gallop Lungs: clear to auscultation bilaterally Extremities: edema mild Wound: incision ok  Lab Results:  Recent Labs  12/20/12 0415  WBC 8.2  HGB 12.0  HCT 36.1  PLT 212   BMET:  Recent Labs  12/20/12 0415  NA 139  Compton 3.7  CL 107  CO2 23  GLUCOSE 127*  BUN 13  CREATININE 0.74  CALCIUM 8.6    PT/INR: No results found for this basename: LABPROT, INR,  in the last 72 hours ABG    Component Value Date/Time   PHART 7.302* 12/20/2012 0623   HCO3 23.3 12/20/2012 0623   TCO2 25 12/20/2012 0623   ACIDBASEDEF 3.0* 12/20/2012 0623   O2SAT 98.0 12/20/2012 0623   CBG (last 3)   Recent Labs  12/20/12 0333 12/20/12 0802 12/20/12 1133  GLUCAP 127* 101* 210*    Assessment/Plan: S/P Procedure(s) (LRB): SUBXYPHOID PERICARDIAL WINDOW (N/A) INTRAOPERATIVE TRANSESOPHAGEAL ECHOCARDIOGRAM (N/A) Mobilize Diuresis Continue foley due to diuresing patient and patient in  ICU   LOS: 1 day    Heather Compton,Heather Compton 12/20/2012

## 2012-12-21 LAB — COMPREHENSIVE METABOLIC PANEL
ALT: 19 U/L (ref 0–35)
AST: 26 U/L (ref 0–37)
Albumin: 2.6 g/dL — ABNORMAL LOW (ref 3.5–5.2)
Alkaline Phosphatase: 61 U/L (ref 39–117)
Chloride: 101 mEq/L (ref 96–112)
Potassium: 4.4 mEq/L (ref 3.5–5.1)
Sodium: 136 mEq/L (ref 135–145)
Total Bilirubin: 0.3 mg/dL (ref 0.3–1.2)
Total Protein: 5.9 g/dL — ABNORMAL LOW (ref 6.0–8.3)

## 2012-12-21 LAB — CBC
HCT: 35.3 % — ABNORMAL LOW (ref 36.0–46.0)
Hemoglobin: 12.3 g/dL (ref 12.0–15.0)
MCH: 32.8 pg (ref 26.0–34.0)
MCHC: 34.8 g/dL (ref 30.0–36.0)
MCV: 94.1 fL (ref 78.0–100.0)
Platelets: 142 10*3/uL — ABNORMAL LOW (ref 150–400)
RBC: 3.75 MIL/uL — ABNORMAL LOW (ref 3.87–5.11)
RDW: 14.2 % (ref 11.5–15.5)
WBC: 3.4 10*3/uL — ABNORMAL LOW (ref 4.0–10.5)

## 2012-12-21 LAB — GLUCOSE, CAPILLARY
Glucose-Capillary: 105 mg/dL — ABNORMAL HIGH (ref 70–99)
Glucose-Capillary: 110 mg/dL — ABNORMAL HIGH (ref 70–99)
Glucose-Capillary: 128 mg/dL — ABNORMAL HIGH (ref 70–99)
Glucose-Capillary: 180 mg/dL — ABNORMAL HIGH (ref 70–99)
Glucose-Capillary: 128 mg/dL — ABNORMAL HIGH (ref 70–99)

## 2012-12-21 NOTE — Progress Notes (Signed)
2 Days Post-Op Procedure(s) (LRB): SUBXYPHOID PERICARDIAL WINDOW (N/A) INTRAOPERATIVE TRANSESOPHAGEAL ECHOCARDIOGRAM (N/A) Subjective: No complaints  Objective: Vital signs in last 24 hours: Temp:  [97.7 F (36.5 C)-98.5 F (36.9 C)] 97.7 F (36.5 C) (09/07 0815) Pulse Rate:  [62-103] 103 (09/07 1100) Cardiac Rhythm:  [-] Normal sinus rhythm (09/07 0800) Resp:  [15-25] 20 (09/07 1100) BP: (80-145)/(26-70) 98/63 mmHg (09/07 1100) SpO2:  [95 %-99 %] 98 % (09/07 1100) Arterial Line BP: (125-136)/(54-59) 126/58 mmHg (09/06 1600) Weight:  [104.3 kg (229 lb 15 oz)] 104.3 kg (229 lb 15 oz) (09/07 0600)  Hemodynamic parameters for last 24 hours:    Intake/Output from previous day: 09/06 0701 - 09/07 0700 In: 1060 [P.O.:610; I.V.:450] Out: 4650 [Urine:4540; Chest Tube:110] Intake/Output this shift: Total I/O In: 380 [P.O.:300; I.V.:80] Out: 265 [Urine:225; Chest Tube:40]  General appearance: alert and cooperative Heart: regular rate and rhythm, S1, S2 normal, no murmur, click, rub or gallop Lungs: clear to auscultation bilaterally  Wound: incision ok   Lab Results:  Recent Labs  12/20/12 0415 12/21/12 0458  WBC 8.2 3.4*  HGB 12.0 12.3  HCT 36.1 35.3*  PLT 212 142*   BMET:  Recent Labs  12/20/12 0415 12/21/12 0415  NA 139 136  K 3.7 4.4  CL 107 101  CO2 23 25  GLUCOSE 127* 116*  BUN 13 13  CREATININE 0.74 0.81  CALCIUM 8.6 8.5    PT/INR: No results found for this basename: LABPROT, INR,  in the last 72 hours ABG    Component Value Date/Time   PHART 7.302* 12/20/2012 0623   HCO3 23.3 12/20/2012 0623   TCO2 25 12/20/2012 0623   ACIDBASEDEF 3.0* 12/20/2012 0623   O2SAT 98.0 12/20/2012 0623   CBG (last 3)   Recent Labs  12/20/12 2323 12/21/12 0609 12/21/12 0811  GLUCAP 105* 110* 128*    Assessment/Plan: S/P Procedure(s) (LRB): SUBXYPHOID PERICARDIAL WINDOW (N/A) INTRAOPERATIVE TRANSESOPHAGEAL ECHOCARDIOGRAM (N/A) CT output is low but had 50 cc out  overnight. Will keep it in today. She needs PT.   LOS: 2 days    Heather Compton K 12/21/2012

## 2012-12-21 NOTE — Plan of Care (Signed)
Problem: Phase III Progression Outcomes Goal: Advance to regular diet without nausea Outcome: Completed/Met Date Met:  12/21/12 Carbohydrate modified

## 2012-12-22 ENCOUNTER — Inpatient Hospital Stay (HOSPITAL_COMMUNITY): Payer: Medicaid Other

## 2012-12-22 ENCOUNTER — Encounter (HOSPITAL_COMMUNITY): Payer: Self-pay | Admitting: Cardiothoracic Surgery

## 2012-12-22 LAB — GLUCOSE, CAPILLARY
Glucose-Capillary: 105 mg/dL — ABNORMAL HIGH (ref 70–99)
Glucose-Capillary: 116 mg/dL — ABNORMAL HIGH (ref 70–99)
Glucose-Capillary: 124 mg/dL — ABNORMAL HIGH (ref 70–99)
Glucose-Capillary: 130 mg/dL — ABNORMAL HIGH (ref 70–99)
Glucose-Capillary: 136 mg/dL — ABNORMAL HIGH (ref 70–99)
Glucose-Capillary: 149 mg/dL — ABNORMAL HIGH (ref 70–99)

## 2012-12-22 LAB — BASIC METABOLIC PANEL
CO2: 25 mEq/L (ref 19–32)
Calcium: 8.4 mg/dL (ref 8.4–10.5)
Creatinine, Ser: 0.96 mg/dL (ref 0.50–1.10)
Glucose, Bld: 121 mg/dL — ABNORMAL HIGH (ref 70–99)

## 2012-12-22 LAB — CBC
Hemoglobin: 11.4 g/dL — ABNORMAL LOW (ref 12.0–15.0)
MCH: 31.6 pg (ref 26.0–34.0)
MCV: 96.7 fL (ref 78.0–100.0)
RBC: 3.61 MIL/uL — ABNORMAL LOW (ref 3.87–5.11)

## 2012-12-22 MED ORDER — SODIUM CHLORIDE 0.9 % IJ SOLN: 3.0000 mL | INTRAMUSCULAR | Status: DC | PRN

## 2012-12-22 MED ORDER — MOVING RIGHT ALONG BOOK
Freq: Once | Status: AC
  Administered 2012-12-22: 1
  Filled 2012-12-22: qty 1

## 2012-12-22 MED ORDER — SODIUM CHLORIDE 0.9 % IJ SOLN
3.0000 mL | Freq: Two times a day (BID) | INTRAMUSCULAR | Status: DC
  Administered 2012-12-22 – 2012-12-23 (×3): 3 mL via INTRAVENOUS

## 2012-12-22 MED ORDER — SODIUM CHLORIDE 0.9 % IV SOLN: 250.0000 mL | INTRAVENOUS | Status: DC | PRN

## 2012-12-22 NOTE — Progress Notes (Signed)
Utilization review completed.  P.J. Zenon Leaf,RN,BSN Case Manager [phone]  

## 2012-12-22 NOTE — Plan of Care (Signed)
Problem: Phase III Progression Outcomes Goal: Time patient transferred to PCTU/Telemetry POD Outcome: Completed/Met Date Met:  12/22/12 Transferred to 2W15 via wheelchair with propak.  Tolerated transfer well.  Placed on monitor on 2W.  Vitals stable.

## 2012-12-22 NOTE — Progress Notes (Addendum)
      [address]       [place] [address]             [phone]      3 Days Post-Op Procedure(s) (LRB): SUBXYPHOID PERICARDIAL WINDOW (N/A) INTRAOPERATIVE TRANSESOPHAGEAL ECHOCARDIOGRAM (N/A)  Subjective:  Ms. Kallstrom complains of being very cold this morning.  She has ambulated.  No BM  Objective: Vital signs in last 24 hours: Temp:  [97.7 F (36.5 C)-98.6 F (37 C)] 98.1 F (36.7 C) (09/08 0400) Pulse Rate:  [81-114] 88 (09/08 0700) Cardiac Rhythm:  [-] Normal sinus rhythm (09/08 0700) Resp:  [14-29] 16 (09/08 0700) BP: (80-117)/(33-66) 104/58 mmHg (09/08 0700) SpO2:  [93 %-99 %] 97 % (09/08 0700) Weight:  [231 lb 7.7 oz (105 kg)] 231 lb 7.7 oz (105 kg) (09/08 0600)  Intake/Output from previous day: 09/07 0701 - 09/08 0700 In: 1260 [P.O.:700; I.V.:460; IV Piggyback:100] Out: 1560 [Urine:1515; Chest Tube:45]  General appearance: alert, cooperative and no distress Heart: regular rate and rhythm Lungs: clear to auscultation bilaterally Abdomen: soft, non-tender; bowel sounds normal; no masses,  no organomegaly Wound: clean and dry  Lab Results:  Recent Labs  12/21/12 0458 12/22/12 0345  WBC 3.4* 5.6  HGB 12.3 11.4*  HCT 35.3* 34.9*  PLT 142* 186   BMET:  Recent Labs  12/21/12 0415 12/22/12 0345  NA 136 136  K 4.4 3.5  CL 101 101  CO2 25 25  GLUCOSE 116* 121*  BUN 13 22  CREATININE 0.81 0.96  CALCIUM 8.5 8.4    PT/INR: No results found for this basename: LABPROT, INR,  in the last 72 hours ABG    Component Value Date/Time   PHART 7.302* 12/20/2012 0623   HCO3 23.3 12/20/2012 0623   TCO2 25 12/20/2012 0623   ACIDBASEDEF 3.0* 12/20/2012 0623   O2SAT 98.0 12/20/2012 0623   CBG (last 3)   Recent Labs  12/21/12 1745 12/22/12 0024 12/22/12 0615  GLUCAP 128* 130* 124*    Assessment/Plan: S/P Procedure(s) (LRB): SUBXYPHOID PERICARDIAL WINDOW (N/A) INTRAOPERATIVE TRANSESOPHAGEAL ECHOCARDIOGRAM (N/A)  1. Chest tube- 45 cc  output yesterday, management per PVT 2. CV- NSR good rate and pressure control 3. Pulm- wean oxygen as tolerated, CXR stuck in machine, will have to report to Radiology to view 4. Renal- creatinine ok, hypokalemia, replacement per protocol 5. Dispo- patient stable, chest tube management per staff, transfer to 2000   LOS: 3 days    BARRETT, ERIN 12/22/2012  Path and cultures of pericardium negative patient examined and medical record reviewed,agree with above note. VAN TRIGT III,Thamas Appleyard 12/22/2012

## 2012-12-22 NOTE — Evaluation (Signed)
Physical Therapy Evaluation Patient Details Name: Heather Compton MRN: [id number] DOB: May 19, 1945 Today's Date: 12/22/2012 Time: 9147-8295 PT Time Calculation (min): 25 min  PT Assessment / Plan / Recommendation History of Present Illness  SUBXYPHOID PERICARDIAL WINDOW (  Clinical Impression  Pt with limited activity tolerance without excessive mobility PTA. Will follow acutely to address below deficits and maximize function and independence to decrease burden of care at discharge.     PT Assessment  Patient needs continued PT services    Follow Up Recommendations  Home health PT;Supervision/Assistance - 24 hour    Does the patient have the potential to tolerate intense rehabilitation      Barriers to Discharge        Equipment Recommendations  Rolling walker with 5" wheels    Recommendations for Other Services     Frequency Min 3X/week    Precautions / Restrictions Precautions Precaution Comments: chest tube   Pertinent Vitals/Pain 7/10 surgical site pain HR 103-123 94% on RA      Mobility  Bed Mobility Bed Mobility: Sit to Supine Sit to Supine: 3: Mod assist;HOB flat Details for Bed Mobility Assistance: cueing for sequence  Transfers Transfers: Sit to Stand;Stand to Sit Sit to Stand: 4: Min assist;From chair/3-in-1 Stand to Sit: To chair/3-in-1;4: Min assist;To bed Details for Transfer Assistance: x 2 trials with cueing for hand placement and safety Ambulation/Gait Ambulation/Gait Assistance: 4: Min assist Ambulation Distance (Feet): 75 Feet (x 2 trials with seated rest between) Assistive device: Rolling walker Ambulation/Gait Assistance Details: cueing for posture, position in RW and directional cues Gait Pattern: Narrow base of support;Decreased stride length;Step-through pattern Gait velocity: decreased Stairs: No    Exercises     PT Diagnosis: Difficulty walking;Generalized weakness  PT Problem List: Decreased strength;Decreased activity  tolerance;Decreased mobility;Decreased knowledge of use of DME PT Treatment Interventions: Gait training;DME instruction;Functional mobility training;Therapeutic activities;Therapeutic exercise;Patient/family education     PT Goals(Current goals can be found in the care plan section) Acute Rehab PT Goals Patient Stated Goal: return home with family  PT Goal Formulation: With patient Time For Goal Achievement: 01/05/13 Potential to Achieve Goals: Fair  Visit Information  Last PT Received On: 12/22/12 Assistance Needed: +1 History of Present Illness: SUBXYPHOID PERICARDIAL WINDOW (       Prior Functioning  Home Living Family/patient expects to be discharged to:: Private residence Living Arrangements: Spouse/significant other Available Help at Discharge: Family;Available 24 hours/day Type of Home: Mobile home Home Access: Ramped entrance Home Layout: One level Home Equipment: Cane - single point Prior Function Level of Independence: Needs assistance Gait / Transfers Assistance Needed: pt uses cane in the house and holds onto spouse outside of house ADL's / Homemaking Assistance Needed: spouse assists with bathing and dressing, he performs all housework/cooking Communication Communication: No difficulties (very soft spoken)    Cognition  Cognition Arousal/Alertness: Awake/alert Behavior During Therapy: WFL for tasks assessed/performed Overall Cognitive Status: Within Functional Limits for tasks assessed    Extremity/Trunk Assessment Upper Extremity Assessment Upper Extremity Assessment: Generalized weakness Lower Extremity Assessment Lower Extremity Assessment: Generalized weakness Cervical / Trunk Assessment Cervical / Trunk Assessment: Kyphotic   Balance    End of Session PT - End of Session Equipment Utilized During Treatment: Gait belt Activity Tolerance: Patient tolerated treatment well Patient left: in bed;with call bell/phone within reach Nurse Communication:  Mobility status  GP     Delorse Lek 12/22/2012, 9:28 AM  Delaney Meigs, PT [phone]

## 2012-12-23 ENCOUNTER — Inpatient Hospital Stay (HOSPITAL_COMMUNITY): Payer: Medicaid Other

## 2012-12-23 LAB — BASIC METABOLIC PANEL
BUN: 24 mg/dL — ABNORMAL HIGH (ref 6–23)
Calcium: 8.4 mg/dL (ref 8.4–10.5)
Creatinine, Ser: 1.07 mg/dL (ref 0.50–1.10)
GFR calc non Af Amer: 54 mL/min — ABNORMAL LOW (ref >90)
Glucose, Bld: 98 mg/dL (ref 70–99)
Sodium: 138 mEq/L (ref 135–145)

## 2012-12-23 LAB — CBC
HCT: 32.6 % — ABNORMAL LOW (ref 36.0–46.0)
Hemoglobin: 10.8 g/dL — ABNORMAL LOW (ref 12.0–15.0)
MCH: 31.7 pg (ref 26.0–34.0)
MCHC: 33.1 g/dL (ref 30.0–36.0)
MCV: 95.6 fL (ref 78.0–100.0)

## 2012-12-23 LAB — GLUCOSE, CAPILLARY
Glucose-Capillary: 81 mg/dL (ref 70–99)
Glucose-Capillary: 178 mg/dL — ABNORMAL HIGH (ref 70–99)
Glucose-Capillary: 86 mg/dL (ref 70–99)
Glucose-Capillary: 110 mg/dL — ABNORMAL HIGH (ref 70–99)

## 2012-12-23 LAB — BODY FLUID CULTURE
Culture: NO GROWTH
Gram Stain: NONE SEEN

## 2012-12-23 LAB — TISSUE CULTURE
Culture: NO GROWTH
Gram Stain: NONE SEEN

## 2012-12-23 MED ORDER — SODIUM CHLORIDE 0.9 % IJ SOLN: 10.0000 mL | Freq: Two times a day (BID) | INTRAMUSCULAR | Status: DC

## 2012-12-23 MED ORDER — SODIUM CHLORIDE 0.9 % IJ SOLN
10.0000 mL | INTRAMUSCULAR | Status: DC | PRN
  Administered 2012-12-23: 20 mL

## 2012-12-23 MED ORDER — INSULIN ASPART 100 UNIT/ML ~~LOC~~ SOLN
0.0000 [IU] | Freq: Three times a day (TID) | SUBCUTANEOUS | Status: DC
  Administered 2012-12-23: 4 [IU] via SUBCUTANEOUS

## 2012-12-23 NOTE — Progress Notes (Addendum)
      [address]       [place] [address]             [phone]      4 Days Post-Op Procedure(s) (LRB): SUBXYPHOID PERICARDIAL WINDOW (N/A) INTRAOPERATIVE TRANSESOPHAGEAL ECHOCARDIOGRAM (N/A)  Subjective:  Ms. Conkle complains of being tired this morning.  Patient encouraged to ambulate today.  No BM Objective: Vital signs in last 24 hours: Temp:  [97.6 F (36.4 C)-99.7 F (37.6 C)] 97.6 F (36.4 C) (09/09 0520) Pulse Rate:  [83-102] 89 (09/09 0520) Cardiac Rhythm:  [-] Sinus tachycardia;Normal sinus rhythm (09/08 2145) Resp:  [18-24] 18 (09/09 0520) BP: (96-145)/(46-85) 123/85 mmHg (09/09 0520) SpO2:  [91 %-98 %] 98 % (09/09 0849) Weight:  [228 lb 9.6 oz (103.692 kg)] 228 lb 9.6 oz (103.692 kg) (09/09 0500)  Intake/Output from previous day: 09/08 0701 - 09/09 0700 In: 190 [P.O.:120; I.V.:20; IV Piggyback:50] Out: 850 [Urine:850]  General appearance: alert, cooperative and no distress Heart: regular rate and rhythm Lungs: diminished breath sounds left base Abdomen: soft, non-tender; bowel sounds normal; no masses,  no organomegaly Wound: clean and dry  Lab Results:  Recent Labs  12/22/12 0345 12/23/12 0448  WBC 5.6 5.4  HGB 11.4* 10.8*  HCT 34.9* 32.6*  PLT 186 213   BMET:  Recent Labs  12/22/12 0345 12/23/12 0448  NA 136 138  K 3.5 3.6  CL 101 106  CO2 25 24  GLUCOSE 121* 98  BUN 22 24*  CREATININE 0.96 1.07  CALCIUM 8.4 8.4    PT/INR: No results found for this basename: LABPROT, INR,  in the last 72 hours ABG    Component Value Date/Time   PHART 7.302* 12/20/2012 0623   HCO3 23.3 12/20/2012 0623   TCO2 25 12/20/2012 0623   ACIDBASEDEF 3.0* 12/20/2012 0623   O2SAT 98.0 12/20/2012 0623   CBG (last 3)   Recent Labs  12/22/12 1616 12/22/12 2058 12/23/12 0611  GLUCAP 149* 105* 81    Assessment/Plan: S/P Procedure(s) (LRB): SUBXYPHOID PERICARDIAL WINDOW (N/A) INTRAOPERATIVE TRANSESOPHAGEAL ECHOCARDIOGRAM (N/A)  1.  CV- NSR rate and pressure controlled 2. Pulm-small left pleural effusion, bibasilar atelectasis, off oxygen continue IS 3. Renal- creatinine okay, hypokalemia improved, stable volume status 4. Deconditioning- continue PT/OT, recommends home health 5. Dispo- patient stable, likely d/c in AM   LOS: 4 days    BARRETT, ERIN 12/23/2012  Pericardial fluid in tissue all negative on cultures and pathology Plan discharge to home tomorrow

## 2012-12-23 NOTE — Progress Notes (Signed)
CARDIAC REHAB PHASE I   PRE:  Rate/Rhythm: 90 SR    BP: sitting 120/70    SaO2: 94 RA  MODE:  Ambulation: 160 ft   POST:  Rate/Rhythm: 112 ST    BP: sitting 130/70     SaO2: 97 RA  Pt asleep on arrival, difficult to wake but then agreeable to walk. Lethargic but seemed stronger than PT noted yesterday. Used RW, sts she would like RW for home. Assist x2 with gait belt. Husband followed with chair but she did not need. To recliner after walk. Grateful for walking.  [phone]   Elissa Lovett Cressey CES, ACSM 12/23/2012 11:55 AM

## 2012-12-23 NOTE — Discharge Summary (Signed)
Physician Discharge Summary  Patient ID: Heather Compton MRN: [id number] DOB/AGE: [birth year] 65 y.o.  Admit date: 12/19/2012 Discharge date: 12/23/2012  Admission Diagnoses:  Patient Active Problem List   Diagnosis Date Noted  . Schizophrenia 12/11/2012  . Pericardial effusion 10/30/2012  . Chest pain 10/30/2012  . SOB (shortness of breath) 10/30/2012   Discharge Diagnoses:   Patient Active Problem List   Diagnosis Date Noted  . Schizophrenia 12/11/2012  . Pericardial effusion 10/30/2012  . Chest pain 10/30/2012   S/P Sub-Xiphoid Pericardial Window   . SOB (shortness of breath) 10/30/2012   Discharged Condition: good  History of Present Illness:   [name] is a 67 year old obese Hispanic female with known history of schizophrenia and COPD.  She was evaluated by Dr. Kirke Corin for complaints of Pericarditis.  Serial Echocardiograms were obtained and showed continued increase in Pericardial Effusion.  There was preserved LV function and no evidence of valvular heart disease.  The patient was symptomatic with complaints of chest pain and shortness of breath.  Due to these findings Dr. Kirke Corin was concerned that should the Pericardial effusion continue to grow the patient could be at risk to develop constriction.  Therefore, she was referred to [facility] for surgical evaluation.  She was evaluated by Dr. Donata Clay on 12/11/2012 at which time it was felt the patient would benefit from drainage of her Pericardial Effusion.  This would be done via a sub-xiphoid approach. Due to the patient's COPD it was felt she should undergo CT scan of the chest  prior to proceeding.  This was completed and showed evidence of bibasilar atelectasis.  Therefore it was felt the patient would be safe to proceed with surgery.  The risks and benefits of the procedure were explained to the patient and she was agreeable to proceed.      Hospital Course: The patient presented to [facility] on 12/19/2012.  She was  taken to the operating room and underwent Sub-Xiphoid Drainage of Pericardial Effusion.  She tolerated the procedure well.  She was extubated in the recovery room and taken to the SICU for further convalescence.  The patient progressed well.  She was diuresed without difficulty.  Pericardial drain output decreased over the next several days and her drainage tube was removed without difficulty.  She was transferred to the Telemetry unit in stable condition.  The patient continues to progress.  Her follow up CXR shows evidence of bibasilar atelectasis and some left sided pleural effusion.  She is ambulating with assistance of a walker.  Physical therapy has evaluated the patient and they feel she is safe to be discharged home with home health.  Her fluid cultures obtained in the operating room were negative for infection and malignancy.  Tissue pathology was positive for Benign Mesothelial Lined Fibroadipose tissue. She is medically stable at this time.  Should no further issues arise we anticipate discharge home in the next 24-48 hours.  Significant Diagnostic Studies:  Pathology  - BENIGN MESOTHELIAL LINED FIBROADIPOSE TISSUE.  Treatments: surgery:   Subxiphoid pericardial window.  Disposition: Home with home health  Discharge Medications:     Medication List    STOP taking these medications       mupirocin ointment 2 %  Commonly known as:  BACTROBAN      TAKE these medications       albuterol 108 (90 BASE) MCG/ACT inhaler  Commonly known as:  PROVENTIL HFA;VENTOLIN HFA  Inhale 2 puffs into the lungs every 6 (six) hours  as needed for wheezing.     calcitRIOL 0.25 MCG capsule  Commonly known as:  ROCALTROL  Take 0.25 mcg by mouth 3 (three) times a week. Monday, Wednesday, and friday     clonazePAM 1 MG tablet  Commonly known as:  KLONOPIN  Take 2 mg by mouth at bedtime.     Fluticasone-Salmeterol 100-50 MCG/DOSE Aepb  Commonly known as:  ADVAIR  Inhale 1 puff into the lungs  every 12 (twelve) hours.     HYDROcodone-acetaminophen 5-325 MG per tablet  Commonly known as:  NORCO/VICODIN  Take 1 tablet by mouth every 4 (four) hours as needed for pain.     levothyroxine 137 MCG tablet  Commonly known as:  SYNTHROID, LEVOTHROID  Take 137 mcg by mouth daily before breakfast.     lubiprostone 24 MCG capsule  Commonly known as:  AMITIZA  Take 24 mcg by mouth 2 (two) times daily with a meal.     metoprolol tartrate 12.5 mg Tabs tablet  Commonly known as:  LOPRESSOR  Take 12.5 mg by mouth 2 (two) times daily.     QUEtiapine 400 MG tablet  Commonly known as:  SEROQUEL  Take 800 mg by mouth at bedtime.     simvastatin 10 MG tablet  Commonly known as:  ZOCOR  Take 10 mg by mouth at bedtime.     temazepam 15 MG capsule  Commonly known as:  RESTORIL  Take 15 mg by mouth 2 (two) times daily.     tiotropium 18 MCG inhalation capsule  Commonly known as:  SPIRIVA  Place 18 mcg into inhaler and inhale daily.     topiramate 100 MG tablet  Commonly known as:  TOPAMAX  Take 100 mg by mouth 2 (two) times daily.     traZODone 100 MG tablet  Commonly known as:  DESYREL  Take 300 mg by mouth at bedtime.     VICTOZA 18 MG/3ML Sopn  Generic drug:  Liraglutide  Inject 1 application into the skin daily.     Vitamin D3 50000 UNITS Caps  Take 50,000 Units by mouth every 14 (fourteen) days.     ziprasidone 80 MG capsule  Commonly known as:  GEODON  Take 80 mg by mouth 2 (two) times daily with a meal.            Future Appointments Provider Department Dept Phone   01/07/2013 10:30 AM Kerin Perna, MD [facility] [address] [phone]         Follow-up Information   Follow up with VAN Dinah Beers, MD On 01/07/2013. (Appointment is at 10:30)    Specialty:  Cardiothoracic Surgery   Contact information:   [address] [place] [address] [place] [address] [phone]       Follow up with [facility] On  01/07/2013. (Please get CXR 1 hour prior to appointment)    Contact information:   [place]       Schedule an appointment as soon as possible for a visit with Lorine Bears, MD. (please set up follow up for 2-4 weeks)    Specialty:  Cardiology   Contact information:   [facility] [address] [place] [address] [phone]      Signed: Lowella Dandy 12/23/2012, 12:36 PM

## 2012-12-24 LAB — ANAEROBIC CULTURE
Gram Stain: NONE SEEN
Gram Stain: NONE SEEN

## 2012-12-24 LAB — GLUCOSE, CAPILLARY
Glucose-Capillary: 118 mg/dL — ABNORMAL HIGH (ref 70–99)
Glucose-Capillary: 132 mg/dL — ABNORMAL HIGH (ref 70–99)

## 2012-12-24 MED ORDER — HYDROCODONE-ACETAMINOPHEN 5-325 MG PO TABS: 1.0000 | ORAL_TABLET | ORAL | Status: DC | PRN

## 2012-12-24 MED ORDER — METOPROLOL TARTRATE 12.5 MG HALF TABLET
12.5000 mg | ORAL_TABLET | Freq: Two times a day (BID) | ORAL | Status: DC
  Administered 2012-12-24: 12.5 mg via ORAL
  Filled 2012-12-24 (×2): qty 1

## 2012-12-24 NOTE — Progress Notes (Signed)
Discharged to home with family office visits in place teaching done  

## 2012-12-24 NOTE — Progress Notes (Signed)
Heather Compton refuses to wear cpap

## 2012-12-24 NOTE — Care Management Note (Addendum)
    Page 1 of 2   12/24/2012     11:57:19 AM   CARE MANAGEMENT NOTE 12/24/2012  Patient:  Heather Compton, Heather Compton   Account Number:  [id number]  Date Initiated:  12/24/2012  Documentation initiated by:  Malyia Moro  Subjective/Objective Assessment:   PT S/P SUBXIPHOID PERICARDIAL WINDOW ON 12/19/12.  PTA, PT RESIDES AT HOME WITH SPOUSE.     Action/Plan:   PT AND OT RECOMMENDED FOR HOME, HOWEVER, MEDICAID WILL NOT COVER THESE HOME THERAPIES.  PT/HUSB MADE AWARE.   Anticipated DC Date:  12/24/2012   Anticipated DC Plan:  HOME W HOME HEALTH SERVICES      DC Planning Services  CM consult      [facility] Choice  HOME HEALTH   Choice offered to / List presented to:  C-1 Patient   DME arranged  WALKER - ROLLING  BEDSIDE COMMODE      DME agency  [facility]     [facility] arranged  HH-1 RN      [facility] agency  [facility]   Status of service:  Completed, signed off Medicare Important Message given?   (If response is "NO", the following Medicare IM given date fields will be blank) Date Medicare IM given:   Date Additional Medicare IM given:    Discharge Disposition:  HOME W HOME HEALTH SERVICES  Per UR Regulation:  Reviewed for med. necessity/level of care/duration of stay  If discussed at Long Length of Stay Meetings, dates discussed:    Comments:  12/24/12 Kimorah Ridolfi,RN,BSN [phone] MET WITH PT AND HUSBAND TO FINALIZE DC PLANS.  HUSBAND TO PROVIDE 24HR CARE.  WILL ARRANGE HHRN FOR RESTORATIVE CARE. REFERRAL TO [facility], PER PT/HUSB CHOICE.  DME FOR DELIVERY TO PT ROOM PRIOR TO DC.  START OF CARE FOR HH 24-48H POST DC. PHYSICAL THERAPIST TO MEET WITH PT PRIOR TO DC TO GO OVER EXERCISES, THERAPY PLAN, AS PT WILL NOT BE RECEIVING HOME THERAPY.

## 2012-12-24 NOTE — Progress Notes (Signed)
Physical Therapy Treatment Patient Details Name: Heather Compton MRN: [id number] DOB: January 01, 1946 Today's Date: 12/24/2012 Time: 1217-1258 PT Time Calculation (min): 41 min  PT Assessment / Plan / Recommendation  History of Present Illness Subxyphoid pericardial window   PT Comments   Pt progressing well but needs encouragement to be motivated. Gave her HEP, practiced and gave her a handout for home. Recommend outpt cardiac rehab when appropriate. Would still benefit from HHPT. PT will continue to follow.   Follow Up Recommendations  Home health PT;Supervision/Assistance - 24 hour     Does the patient have the potential to tolerate intense rehabilitation     Barriers to Discharge        Equipment Recommendations  Rolling walker with 5" wheels    Recommendations for Other Services    Frequency Min 3X/week   Progress towards PT Goals Progress towards PT goals: Progressing toward goals  Plan Current plan remains appropriate    Precautions / Restrictions Precautions Precautions: Fall Restrictions Weight Bearing Restrictions: No   Pertinent Vitals/Pain No c/o pain    Mobility  Bed Mobility Bed Mobility: Not assessed Transfers Transfers: Sit to Stand;Stand to Sit Sit to Stand: 4: Min guard;From chair/3-in-1;From toilet;With upper extremity assist Stand to Sit: 4: Min guard;To chair/3-in-1;To toilet;With upper extremity assist Details for Transfer Assistance: vc's for hand placement with stand to sit and to control motion, vc's to scoot to edge of chair before trying to stand Ambulation/Gait Ambulation/Gait Assistance: 4: Min guard Ambulation Distance (Feet): 175 Feet Assistive device: Rolling walker Ambulation/Gait Assistance Details: pt with good posture during ambulation, verbal encouragement for increased distance, vc's for keeping RW with her until seated as she tried to push it away and walk without it when she returns to room Gait Pattern: Decreased stride  length Gait velocity: decreased Stairs: No Wheelchair Mobility Wheelchair Mobility: No    Exercises General Exercises - Lower Extremity Ankle Circles/Pumps: AROM;Both;10 reps;Seated Long Arc Quad: AROM;Both;5 reps;Seated Heel Slides: AROM;Both;5 reps;Seated Hip ABduction/ADduction: AROM;Both;5 reps;Standing Straight Leg Raises: AROM;Both;5 reps;Seated Hip Flexion/Marching: AROM;Seated;5 reps;Both Heel Raises: AROM;5 reps;Both;Standing Mini-Sqauts: Other (comment) (reviewed but she was fatigued) Other Exercises Other Exercises: chair push up x3 Other Exercises: reviewed bridging in bed Other Exercises: standing scapular retraction Other Exercises: gave pt HEP h.o. Could only tolerate 5 reps of each exercise with rest in between but discussed how to progress   PT Diagnosis:    PT Problem List:   PT Treatment Interventions:     PT Goals (current goals can now be found in the care plan section) Acute Rehab PT Goals Patient Stated Goal: return home with family  PT Goal Formulation: With patient Time For Goal Achievement: 01/05/13 Potential to Achieve Goals: Fair  Visit Information  Last PT Received On: 12/24/12 Assistance Needed: +1 History of Present Illness: Subxyphoid pericardial window    Subjective Data  Subjective: pt reports feeling tired Patient Stated Goal: return home with family    Cognition  Cognition Arousal/Alertness: Awake/alert Behavior During Therapy: WFL for tasks assessed/performed Overall Cognitive Status: Within Functional Limits for tasks assessed    Balance  Balance Balance Assessed: Yes Dynamic Standing Balance Dynamic Standing - Balance Support: No upper extremity supported;During functional activity Dynamic Standing - Level of Assistance: 4: Min assist Dynamic Standing - Comments: needs UE support for standing dynamic acitvities, stand by assist when holding onto RW  End of Session PT - End of Session Equipment Utilized During Treatment:  Gait belt Activity Tolerance: Patient tolerated treatment well Patient left:  in chair;with call bell/phone within reach;with family/visitor present Nurse Communication: Mobility status   GP   Lyanne Co, PT  Acute Rehab Services  [phone]   Lyanne Co 12/24/2012, 1:39 PM

## 2012-12-24 NOTE — Progress Notes (Addendum)
      [address]       [place] [address]             [phone]        5 Days Post-Op Procedure(s) (LRB): SUBXYPHOID PERICARDIAL WINDOW (N/A) INTRAOPERATIVE TRANSESOPHAGEAL ECHOCARDIOGRAM (N/A)  Subjective: Patient feeling well and is eating breakfast. She has no complaints.  Objective: Vital signs in last 24 hours: Temp:  [97.4 F (36.3 C)-99 F (37.2 C)] 97.4 F (36.3 C) (09/10 0446) Pulse Rate:  [80-93] 80 (09/10 0446) Cardiac Rhythm:  [-] Normal sinus rhythm (09/09 2343) Resp:  [18-20] 18 (09/10 0446) BP: (127-140)/(61-73) 138/61 mmHg (09/10 0446) SpO2:  [94 %-98 %] 94 % (09/10 0446) Weight:  [105.507 kg (232 lb 9.6 oz)] 105.507 kg (232 lb 9.6 oz) (09/10 0446)   Current Weight  12/24/12 105.507 kg (232 lb 9.6 oz)      Intake/Output from previous day: 09/09 0701 - 09/10 0700 In: 360 [P.O.:360] Out: -    Physical Exam:  Cardiovascular: RRR Pulmonary:Slightly diminished at bases; no rales, wheezes, or rhonchi. Abdomen: Soft, non tender, bowel sounds present. Extremities: SCDs in place Wouns: Clean and dry.  No erythema or signs of infection.  Lab Results: CBC: Recent Labs  12/22/12 0345 12/23/12 0448  WBC 5.6 5.4  HGB 11.4* 10.8*  HCT 34.9* 32.6*  PLT 186 213   BMET:  Recent Labs  12/22/12 0345 12/23/12 0448  NA 136 138  K 3.5 3.6  CL 101 106  CO2 25 24  GLUCOSE 121* 98  BUN 22 24*  CREATININE 0.96 1.07  CALCIUM 8.4 8.4    PT/INR:  Lab Results  Component Value Date   INR 0.99 12/16/2012   ABG:  INR: Will add last result for INR, ABG once components are confirmed Will add last 4 CBG results once components are confirmed  Assessment/Plan:  1. CV - SR. Will restart Lopressor as was on pre op. 2.  Acute blood loss anemia - H and H yesterday 10.8 and 32.6 3.Pulmonary-Encourage incentive spirometer 4.Deconditioning-continues to improve with PT 5.Remove central line 6.Discharge today  ZIMMERMAN,DONIELLE  MPA-C 12/24/2012,7:35 AM  patient examined and medical record reviewed,agree with above note. VAN TRIGT III,Davon Folta 12/24/2012

## 2012-12-24 NOTE — Progress Notes (Signed)
CARDIAC REHAB PHASE I   PRE:  Rate/Rhythm: 87 SR    BP: sitting 114/73    SaO2: 94-95 RA  MODE:  Ambulation: 160 ft   POST:  Rate/Rhythm: 99     BP: sitting 128/70     SaO2: 98 RA  Pt leaning backward upon standing, needed verbal cues to place hands on RW in front of her. Fairly steady walking around circle with RW. Needs RW and HHPT for home. Notified RN. To BR after walk, needed assist to wipe herself. Sts her husband usually assists her. To recliner with VSS. Encouraged her to walk daily and use IS. [phone]   Elissa Lovett [place] CES, ACSM 12/24/2012 9:34 AM

## 2012-12-29 NOTE — Discharge Summary (Signed)
patient examined and medical record reviewed,agree with above note. VAN TRIGT III,PETER 12/29/2012   

## 2012-12-31 DIAGNOSIS — J449 Chronic obstructive pulmonary disease, unspecified: Secondary | ICD-10-CM

## 2013-01-05 ENCOUNTER — Other Ambulatory Visit: Payer: Self-pay | Admitting: *Deleted

## 2013-01-05 DIAGNOSIS — I319 Disease of pericardium, unspecified: Secondary | ICD-10-CM

## 2013-01-06 ENCOUNTER — Encounter: Payer: Self-pay | Admitting: Physician Assistant

## 2013-01-06 ENCOUNTER — Ambulatory Visit (INDEPENDENT_AMBULATORY_CARE_PROVIDER_SITE_OTHER): Payer: Medicaid Other | Admitting: Physician Assistant

## 2013-01-06 VITALS — BP 119/79 | HR 71 | Ht 62.5 in | Wt 237.5 lb

## 2013-01-06 DIAGNOSIS — I10 Essential (primary) hypertension: Secondary | ICD-10-CM

## 2013-01-06 DIAGNOSIS — I319 Disease of pericardium, unspecified: Secondary | ICD-10-CM

## 2013-01-06 DIAGNOSIS — E785 Hyperlipidemia, unspecified: Secondary | ICD-10-CM

## 2013-01-06 DIAGNOSIS — R0602 Shortness of breath: Secondary | ICD-10-CM

## 2013-01-06 DIAGNOSIS — R079 Chest pain, unspecified: Secondary | ICD-10-CM

## 2013-01-06 DIAGNOSIS — I313 Pericardial effusion (noninflammatory): Secondary | ICD-10-CM

## 2013-01-06 MED ORDER — ASPIRIN EC 81 MG PO TBEC: 81.0000 mg | DELAYED_RELEASE_TABLET | Freq: Every day | ORAL | Status: DC

## 2013-01-06 MED ORDER — NITROGLYCERIN 0.4 MG SL SUBL: 0.4000 mg | SUBLINGUAL_TABLET | SUBLINGUAL | Status: DC | PRN

## 2013-01-06 NOTE — Assessment & Plan Note (Signed)
Continue statin. 

## 2013-01-06 NOTE — Progress Notes (Signed)
Patient ID: Heather Compton, female   DOB: 1945/12/04, 67 y.o.   MRN: [id number]            Date:  01/06/2013   ID:  Heather Compton, DOB 03/01/1946, MRN [id number]  PCP:  Gwenlyn Found, MD  Primary Cardiologist:  Judie Petit. Kirke Corin, MD   History of Present Illness:  Heather Compton is a 67 y.o. female w/ PMHx s/f recurrent pericardial effusions, schizophrenia, DM2, HTN, HLD, morbid obesity, h/o tobacco abuse, h/o cervical CA and hypothyroidism who presents today for post-hospital follow-up.   She followed up with Dr. Welton Flakes earlier this summer for dyspnea given underlying COPD. An outpatient echocardiogram was performed revealing pericardial effusion. She was sent to [facility] ED and was briefly hospitalized (unable to view this in [facility] EMR). Per Dr. Jari Sportsman office note 11/2012, a repeat echocardiogram showed moderate-sized pericardial effusion, no evidence of tamponade, normal EF, but thick, fibrinous material was appreciated. Symptoms included chest pain and DOE. CEs returned WNL. Connective tissue work-up negative. PPD returned positive- f/u sputum test at the health department returned negative x 3. Negative HIV and syphilis.   She had a repeat echo in August given continued dyspnea and chest pain revealing persistent moderate pericardial effusion with persistent, very thick fibrinous material. Given persistent fibrinous, moderate pericardial effusion, ongoing symptoms and risk of constrictive pericardial effusion, the patient was referred to [facility] for consideration of pericardial window for therapeutic and diagnostic purposes.  She was evaluated by Dr. Donata Clay who agreed the patient would benefit from a subxiphoid pericardial window with irrigation and debridement of the pericardial space.   She was informed, consented and prepped for the surgery which occurred on 12/19/12. She tolerated this well and w/o complications. A limited pre-op echo on 9/5 revealed - moderate LVH, EF 55-65%, small  pericardial effusion, echodensities in the pericardial space, cardiac MRI suggested for further evaluation. No evidence of RV diastolic collapse and IVC collapsed normally. Pericardial biopsy revealed benign mesothelial lined fibroadipose tissue. Pericardial fluid revealed reactive mesothelial cells. Culture indicated no evidence of infection (no acid fast bacilli included). She was discharged 12/23/12 and presents today for follow-up.  She reports improved shortness of breath. She continues to have substernal chest pressure w/o radiation aggravated by exertion and relieved with rest. She denies LE edema, orthopnea, PND. This has been occurring for 3 months and has affected her activity level. She states the subxiphoid surgical sites are healing well. No redness, tenderness or drainage.  EKG: NSR, 71 bpm, low voltage QRS, no ST/T changes  Wt Readings from Last 3 Encounters:  01/06/13 237 lb 8 oz (107.729 kg)  12/24/12 232 lb 9.6 oz (105.507 kg)  12/24/12 232 lb 9.6 oz (105.507 kg)     Past Medical History  Diagnosis Date  . Enlarged heart   . Hypothyroidism   . Asthma   . History of shingles   . Cellulitis   . Diabetes mellitus without complication   . History of fall   . Bronchitis   . Cancer of cervix   . Schizoaffective disorder   . COPD (chronic obstructive pulmonary disease)   . Complication of anesthesia     hard to wake up  . Chest pain     with exertion  . Heart murmur   . Pneumonia     numerous times in Jan. 2013  . Varicose veins   . Cramps, extremity   . Anemia   . Cataracts, bilateral   . Sleep apnea     Current  Outpatient Prescriptions  Medication Sig Dispense Refill  . albuterol (PROVENTIL HFA;VENTOLIN HFA) 108 (90 BASE) MCG/ACT inhaler Inhale 2 puffs into the lungs every 6 (six) hours as needed for wheezing.      . calcitRIOL (ROCALTROL) 0.25 MCG capsule Take 0.25 mcg by mouth 3 (three) times a week. Monday, Wednesday, and friday      . Cholecalciferol (VITAMIN  D3) 50000 UNITS CAPS Take 50,000 Units by mouth every 14 (fourteen) days.       . clonazePAM (KLONOPIN) 1 MG tablet Take 2 mg by mouth at bedtime.       . Fluticasone-Salmeterol (ADVAIR) 100-50 MCG/DOSE AEPB Inhale 1 puff into the lungs every 12 (twelve) hours.      Marland Kitchen HYDROcodone-acetaminophen (NORCO/VICODIN) 5-325 MG per tablet Take 1 tablet by mouth every 4 (four) hours as needed for pain.  30 tablet  0  . levothyroxine (SYNTHROID, LEVOTHROID) 137 MCG tablet Take 137 mcg by mouth daily before breakfast.      . Liraglutide (VICTOZA) 18 MG/3ML SOPN Inject 1 application into the skin daily.       Marland Kitchen lubiprostone (AMITIZA) 24 MCG capsule Take 24 mcg by mouth 2 (two) times daily with a meal.      . metoprolol tartrate (LOPRESSOR) 12.5 mg TABS Take 12.5 mg by mouth 2 (two) times daily.      . QUEtiapine (SEROQUEL) 400 MG tablet Take 800 mg by mouth at bedtime.      . simvastatin (ZOCOR) 10 MG tablet Take 10 mg by mouth at bedtime.      . temazepam (RESTORIL) 15 MG capsule Take 15 mg by mouth 2 (two) times daily.      Marland Kitchen tiotropium (SPIRIVA) 18 MCG inhalation capsule Place 18 mcg into inhaler and inhale daily.      Marland Kitchen topiramate (TOPAMAX) 100 MG tablet Take 100 mg by mouth 2 (two) times daily.      . traZODone (DESYREL) 100 MG tablet Take 300 mg by mouth at bedtime.      . ziprasidone (GEODON) 80 MG capsule Take 80 mg by mouth 2 (two) times daily with a meal.       No current facility-administered medications for this visit.    Allergies:   No Known Allergies  Social History:  The patient  reports that she has quit smoking. Her smoking use included Cigarettes. She has a 20 pack-year smoking history. She has never used smokeless tobacco. She reports that she does not drink alcohol or use illicit drugs.   Family History:  Family History  Problem Relation Age of Onset  . Family history unknown: Yes    Review of Systems: General: negative for chills, fever, night sweats or weight changes.    Cardiovascular: positive for chest pain, negative for dyspnea on exertion, edema, orthopnea, palpitations, paroxysmal nocturnal dyspnea or shortness of breath Dermatological: negative for rash Respiratory: negative for cough or wheezing Urologic: negative for hematuria Abdominal: negative for nausea, vomiting, diarrhea, bright red blood per rectum, melena, or hematemesis Neurologic: negative for visual changes, syncope, or dizziness All other systems reviewed and are otherwise negative except as noted above.  PHYSICAL EXAM: VS:  BP 119/79  Pulse 71  Ht 5' 2.5" (1.588 m)  Wt 237 lb 8 oz (107.729 kg)  BMI 42.72 kg/m2  Well developed obese appearing female in no acute distress HEENT: normal, PERRL Neck: no JVD or bruits Cardiac: distant heart sounds, normal S1, S2; RRR; no murmur or gallops Lungs:  clear to auscultation  bilaterally, no wheezing, rhonchi or rales Abd: soft, nontender, no hepatomegaly, normoactive BS x 4 quads Ext: no edema, cyanosis or clubbing Skin: upper abdominal incisions w/o evidence of erythema, tenderness or palpation or discharge, sutures in place; warm and dry, cap refill < 2 sec Neuro:  CNs 2-12 intact, no focal abnormalities noted Musculoskeletal: strength and tone appropriate for age  Psych: normal affect

## 2013-01-06 NOTE — Assessment & Plan Note (Signed)
She reports substernal exertional chest pain occurring for the past 3 months. No improvement with recent pericardial window and drainage. She has significant cardiac RFs in DM2, HTN, HLD and morbid obesity. Will plan outpatient stress testing to further evaluate. Start low-dose ASA, NTG SL PRN.

## 2013-01-06 NOTE — Assessment & Plan Note (Signed)
Well-controlled. Continue current antihypertensives.  

## 2013-01-06 NOTE — Assessment & Plan Note (Signed)
S/p pericardial window and pericardial fluid drainage. Pericardial biopsy indicated no evidence of malignancy or infection (benign mesothelial lined fibroadipose tissue). Pericardial fluid revealed reactive mesothelial cells. Surgical sites healing well w/o evidence of infection. She has follow-up with [facility] tomorrow.

## 2013-01-06 NOTE — Patient Instructions (Addendum)
Please start taking aspirin 81 mg (baby aspirin) daily.   Please take nitroglycerin as prescribed and as we discussed in the office today.   We will schedule a stress test to be performed at [facility] next week.   Please follow-up in 3 months if the stress test is normal.   [facility] MYOVIEW  Your caregiver has ordered a Stress Test with nuclear imaging. The purpose of this test is to evaluate the blood supply to your heart muscle. This procedure is referred to as a "Non-Invasive Stress Test." This is because other than having an IV started in your vein, nothing is inserted or "invades" your body. Cardiac stress tests are done to find areas of poor blood flow to the heart by determining the extent of coronary artery disease (CAD). Some patients exercise on a treadmill, which naturally increases the blood flow to your heart, while others who are  unable to walk on a treadmill due to physical limitations have a pharmacologic/chemical stress agent called Lexiscan . This medicine will mimic walking on a treadmill by temporarily increasing your coronary blood flow.   Please note: these test may take anywhere between 2-4 hours to complete  PLEASE REPORT TO [facility] MEDICAL MALL ENTRANCE  THE VOLUNTEERS AT THE FIRST DESK WILL DIRECT YOU WHERE TO GO  Date of Procedure:_______FRIDAY, OCT 3___________________________  Arrival Time for Procedure:_______9:30 am_______________________     PLEASE NOTIFY THE OFFICE AT LEAST 24 HOURS IN ADVANCE IF YOU ARE UNABLE TO KEEP YOUR APPOINTMENT.  [phone] AND  PLEASE NOTIFY NUCLEAR MEDICINE AT [facility] AT LEAST 24 HOURS IN ADVANCE IF YOU ARE UNABLE TO KEEP YOUR APPOINTMENT. [phone]  How to prepare for your Myoview test:  1. Do not eat or drink after midnight 2. No caffeine for 24 hours prior to test 3. No smoking 24 hours prior to test. 4. Your medication may be taken with water.  If your doctor stopped a medication because of this test,  do not take that medication. 5. Ladies, please do not wear dresses.  Skirts or pants are appropriate. Please wear a short sleeve shirt. 6. No perfume, cologne or lotion. 7. Wear comfortable walking shoes. No heels!

## 2013-01-07 ENCOUNTER — Ambulatory Visit
Admission: RE | Admit: 2013-01-07 | Discharge: 2013-01-07 | Disposition: A | Discharge status: Home / Self Care | Payer: Medicaid Other | Source: Ambulatory Visit | Attending: Cardiothoracic Surgery | Admitting: Cardiothoracic Surgery

## 2013-01-07 ENCOUNTER — Ambulatory Visit (INDEPENDENT_AMBULATORY_CARE_PROVIDER_SITE_OTHER): Payer: Medicaid Other | Admitting: Cardiothoracic Surgery

## 2013-01-07 ENCOUNTER — Encounter: Payer: Self-pay | Admitting: Cardiothoracic Surgery

## 2013-01-07 VITALS — BP 112/68 | HR 74 | Resp 20 | Ht 62.5 in | Wt 237.0 lb

## 2013-01-07 DIAGNOSIS — I319 Disease of pericardium, unspecified: Secondary | ICD-10-CM

## 2013-01-07 DIAGNOSIS — G8918 Other acute postprocedural pain: Secondary | ICD-10-CM

## 2013-01-07 DIAGNOSIS — I313 Pericardial effusion (noninflammatory): Secondary | ICD-10-CM

## 2013-01-07 DIAGNOSIS — Z09 Encounter for follow-up examination after completed treatment for conditions other than malignant neoplasm: Secondary | ICD-10-CM

## 2013-01-07 MED ORDER — HYDROCODONE-ACETAMINOPHEN 5-325 MG PO TABS: 1.0000 | ORAL_TABLET | ORAL | Status: DC | PRN

## 2013-01-07 NOTE — Progress Notes (Signed)
PCP is Gwenlyn Found, MD Referring Provider is Gwenlyn Found, MD  Chief Complaint  Patient presents with  . Routine Post Op    F/U from surgery with CXR, S/P Subxiphoid pericardial window on 12/19/2012    HPI: Postop followup after subxiphoid pericardial window on September 5. Patient had probable post viral pericarditis. Fluid and tissue were negative for culture, negative for pathology. Fluid was clear. Patient's chest x-ray today shows resolution of basilar atelectasis. Patient denies shortness of breath. Surgical incision healing well.  Past Medical History  Diagnosis Date  . Enlarged heart   . Hypothyroidism   . Asthma   . History of shingles   . Cellulitis   . Diabetes mellitus without complication   . History of fall   . Bronchitis   . Cancer of cervix   . Schizoaffective disorder   . COPD (chronic obstructive pulmonary disease)   . Complication of anesthesia     hard to wake up  . Chest pain     with exertion  . Heart murmur   . Pneumonia     numerous times in Jan. 2013  . Varicose veins   . Cramps, extremity   . Anemia   . Cataracts, bilateral   . Sleep apnea     Past Surgical History  Procedure Laterality Date  . Gallbladder surgery    . Gastric bypass    . Cervical surgery    . Breast lumpectomy    . Cesarean section    . Cholecystectomy    . Tubal ligation    . Subxyphoid pericardial window N/A 12/19/2012    Procedure: SUBXYPHOID PERICARDIAL WINDOW;  Surgeon: Kerin Perna, MD;  Location: [facility] OR;  Service: Thoracic;  Laterality: N/A;  . Intraoperative transesophageal echocardiogram N/A 12/19/2012    Procedure: INTRAOPERATIVE TRANSESOPHAGEAL ECHOCARDIOGRAM;  Surgeon: Kerin Perna, MD;  Location: [facility] OR;  Service: Thoracic;  Laterality: N/A;    No family history on file.  Social History History  Substance Use Topics  . Smoking status: Former Smoker -- 1.00 packs/day for 20 years    Types: Cigarettes  . Smokeless tobacco: Never Used  . Alcohol  Use: No    Current Outpatient Prescriptions  Medication Sig Dispense Refill  . albuterol (PROVENTIL HFA;VENTOLIN HFA) 108 (90 BASE) MCG/ACT inhaler Inhale 2 puffs into the lungs every 6 (six) hours as needed for wheezing.      Marland Kitchen aspirin EC 81 MG tablet Take 1 tablet (81 mg total) by mouth daily.  90 tablet  3  . calcitRIOL (ROCALTROL) 0.25 MCG capsule Take 0.25 mcg by mouth 3 (three) times a week. Monday, Wednesday, and friday      . Cholecalciferol (VITAMIN D3) 50000 UNITS CAPS Take 50,000 Units by mouth every 14 (fourteen) days.       . clonazePAM (KLONOPIN) 1 MG tablet Take 2 mg by mouth at bedtime.       . Fluticasone-Salmeterol (ADVAIR) 100-50 MCG/DOSE AEPB Inhale 1 puff into the lungs every 12 (twelve) hours.      Marland Kitchen HYDROcodone-acetaminophen (NORCO/VICODIN) 5-325 MG per tablet Take 1 tablet by mouth every 4 (four) hours as needed for pain.  40 tablet  0  . levothyroxine (SYNTHROID, LEVOTHROID) 137 MCG tablet Take 137 mcg by mouth daily before breakfast.      . Liraglutide (VICTOZA) 18 MG/3ML SOPN Inject 1 application into the skin daily.       Marland Kitchen lubiprostone (AMITIZA) 24 MCG capsule Take 24 mcg by mouth 2 (two)  times daily with a meal.      . metoprolol tartrate (LOPRESSOR) 12.5 mg TABS Take 12.5 mg by mouth 2 (two) times daily.      . nitroGLYCERIN (NITROSTAT) 0.4 MG SL tablet Place 1 tablet (0.4 mg total) under the tongue every 5 (five) minutes as needed for chest pain.  90 tablet  3  . QUEtiapine (SEROQUEL) 400 MG tablet Take 800 mg by mouth at bedtime.      . simvastatin (ZOCOR) 10 MG tablet Take 10 mg by mouth at bedtime.      . temazepam (RESTORIL) 15 MG capsule Take 15 mg by mouth 2 (two) times daily.      Marland Kitchen tiotropium (SPIRIVA) 18 MCG inhalation capsule Place 18 mcg into inhaler and inhale daily.      Marland Kitchen topiramate (TOPAMAX) 100 MG tablet Take 100 mg by mouth 2 (two) times daily.      . traZODone (DESYREL) 100 MG tablet Take 300 mg by mouth at bedtime.      . ziprasidone (GEODON)  80 MG capsule Take 80 mg by mouth 2 (two) times daily with a meal.       No current facility-administered medications for this visit.    No Known Allergies  Review of Systems no fever no shortness of breath no problems with surgical incision  BP 112/68  Pulse 74  Resp 20  Ht 5' 2.5" (1.588 m)  Wt 237 lb (107.502 kg)  BMI 42.63 kg/m2  SpO2 97% Physical Exam Alert and comfortable Breath sounds clear Heart rhythm regular without murmur or rub Surgical incision well-healed Chest tube site suture removed --  well-healed  Diagnostic Tests: Chest x-ray clear  Impression: Doing well after subxiphoid pericardial window for moderate pericardial effusion patient may resume normal activities and normal skin care  Plan: Return to Dr. Kary Kos at cardiology clinic at [facility] as needed. One refill for Vicodin 5.0 provided the patient.

## 2013-01-07 NOTE — Patient Instructions (Signed)
The patient. may take a bath The patient may use skin lotion on the incision

## 2013-01-16 LAB — FUNGUS CULTURE W SMEAR
Fungal Smear: NONE SEEN
Fungal Smear: NONE SEEN

## 2013-02-01 LAB — AFB CULTURE WITH SMEAR (NOT AT ARMC)
Acid Fast Smear: NONE SEEN
Acid Fast Smear: NONE SEEN

## 2013-02-16 DIAGNOSIS — Z9884 Bariatric surgery status: Secondary | ICD-10-CM | POA: Insufficient documentation

## 2013-02-16 DIAGNOSIS — D518 Other vitamin B12 deficiency anemias: Secondary | ICD-10-CM | POA: Insufficient documentation

## 2013-02-16 DIAGNOSIS — D509 Iron deficiency anemia, unspecified: Secondary | ICD-10-CM | POA: Insufficient documentation

## 2013-02-16 DIAGNOSIS — F333 Major depressive disorder, recurrent, severe with psychotic symptoms: Secondary | ICD-10-CM | POA: Insufficient documentation

## 2013-02-16 DIAGNOSIS — E785 Hyperlipidemia, unspecified: Secondary | ICD-10-CM | POA: Insufficient documentation

## 2013-02-25 ENCOUNTER — Ambulatory Visit: Payer: Self-pay | Admitting: Family Medicine

## 2013-03-04 ENCOUNTER — Other Ambulatory Visit: Payer: Self-pay | Admitting: Internal Medicine

## 2013-03-04 DIAGNOSIS — R490 Dysphonia: Secondary | ICD-10-CM

## 2013-03-04 DIAGNOSIS — R1310 Dysphagia, unspecified: Secondary | ICD-10-CM

## 2013-03-16 ENCOUNTER — Ambulatory Visit
Admission: RE | Admit: 2013-03-16 | Discharge: 2013-03-16 | Disposition: A | Discharge status: Home / Self Care | Payer: Medicare Other | Source: Ambulatory Visit | Attending: Internal Medicine | Admitting: Internal Medicine

## 2013-03-16 ENCOUNTER — Other Ambulatory Visit: Payer: Self-pay | Admitting: Internal Medicine

## 2013-03-16 DIAGNOSIS — R490 Dysphonia: Secondary | ICD-10-CM

## 2013-03-16 DIAGNOSIS — R1310 Dysphagia, unspecified: Secondary | ICD-10-CM

## 2013-04-07 ENCOUNTER — Ambulatory Visit (INDEPENDENT_AMBULATORY_CARE_PROVIDER_SITE_OTHER): Payer: Medicare Other | Admitting: Cardiovascular Disease

## 2013-04-07 ENCOUNTER — Encounter: Payer: Self-pay | Admitting: Cardiovascular Disease

## 2013-04-07 VITALS — BP 120/72 | HR 72 | Ht 62.5 in | Wt 240.8 lb

## 2013-04-07 DIAGNOSIS — I319 Disease of pericardium, unspecified: Secondary | ICD-10-CM

## 2013-04-07 DIAGNOSIS — I10 Essential (primary) hypertension: Secondary | ICD-10-CM

## 2013-04-07 DIAGNOSIS — I313 Pericardial effusion (noninflammatory): Secondary | ICD-10-CM

## 2013-04-07 DIAGNOSIS — E785 Hyperlipidemia, unspecified: Secondary | ICD-10-CM

## 2013-04-07 DIAGNOSIS — R079 Chest pain, unspecified: Secondary | ICD-10-CM

## 2013-04-07 NOTE — Patient Instructions (Signed)
Your physician wants you to follow-up in: 6 months  You will receive a reminder letter in the mail two months in advance. If you don't receive a letter, please call our office to schedule the follow-up appointment.  Your physician recommends that you continue on your current medications as directed. Please refer to the Current Medication list given to you today.  

## 2013-04-07 NOTE — Progress Notes (Signed)
HPI  This is a 67 year old female who is here today for followup visit. She has PMHx s/f recurrent pericardial effusions, schizophrenia, DM2, HTN, HLD, morbid obesity, h/o tobacco abuse, h/o cervical CA and hypothyroidism . In July of 2014, an outpatient echocardiogram was performed to evaluate her dyspnea which revealed moderate-sized pericardial effusion. She was treated conservatively. However, repeat echocardiogram showed persistent moderate-sized pericardial effusion, no evidence of tamponade, normal EF, but thick, fibrinous material was appreciated. Symptoms included chest pain and DOE.  Connective tissue work-up negative. PPD returned positive- f/u sputum test at the health department returned negative x 3. Negative HIV and syphilis.  Given persistent fibrinous, moderate pericardial effusion, ongoing symptoms and risk of constrictive pericardial effusion, the patient was referred to [facility] for consideration of pericardial window for therapeutic and diagnostic purposes.  She underwent pericardial window in September. A limited pre-op echo on 9/5 revealed - moderate LVH, EF 55-65%, small pericardial effusion, echodensities in the pericardial space, cardiac MRI suggested for further evaluation. No evidence of RV diastolic collapse and IVC collapsed normally. Pericardial biopsy revealed benign mesothelial lined fibroadipose tissue. Pericardial fluid revealed reactive mesothelial cells. Culture indicated no evidence of infection (no acid fast bacilli included). She was discharged 12/23/12 and presents today for follow-up.  She reports improved shortness of breath. She continued to have atypical chest pain after the procedure. We requested a stress test given her risk factors. However, she did not make it to the appointment. She currently denies any chest tightness. She still has occasional sharp discomfort at the pericardial window site.  No Known Allergies   Current Outpatient Prescriptions on File  Prior to Visit  Medication Sig Dispense Refill  . albuterol (PROVENTIL HFA;VENTOLIN HFA) 108 (90 BASE) MCG/ACT inhaler Inhale 2 puffs into the lungs every 6 (six) hours as needed for wheezing.      . calcitRIOL (ROCALTROL) 0.25 MCG capsule Take 0.25 mcg by mouth 3 (three) times a week. Monday, Wednesday, and friday      . Cholecalciferol (VITAMIN D3) 50000 UNITS CAPS Take 50,000 Units by mouth every 14 (fourteen) days.       . clonazePAM (KLONOPIN) 1 MG tablet Take 2 mg by mouth at bedtime.       . Fluticasone-Salmeterol (ADVAIR) 100-50 MCG/DOSE AEPB Inhale 1 puff into the lungs every 12 (twelve) hours.      Marland Kitchen HYDROcodone-acetaminophen (NORCO/VICODIN) 5-325 MG per tablet Take 1 tablet by mouth every 4 (four) hours as needed for pain.  40 tablet  0  . levothyroxine (SYNTHROID, LEVOTHROID) 137 MCG tablet Take 137 mcg by mouth daily before breakfast.      . Liraglutide (VICTOZA) 18 MG/3ML SOPN Inject 1 application into the skin daily.       Marland Kitchen lubiprostone (AMITIZA) 24 MCG capsule Take 24 mcg by mouth 2 (two) times daily with a meal.      . metoprolol tartrate (LOPRESSOR) 12.5 mg TABS Take 12.5 mg by mouth 2 (two) times daily.      . nitroGLYCERIN (NITROSTAT) 0.4 MG SL tablet Place 1 tablet (0.4 mg total) under the tongue every 5 (five) minutes as needed for chest pain.  90 tablet  3  . QUEtiapine (SEROQUEL) 400 MG tablet Take 800 mg by mouth at bedtime.      . simvastatin (ZOCOR) 10 MG tablet Take 10 mg by mouth at bedtime.      . temazepam (RESTORIL) 15 MG capsule Take 15 mg by mouth 2 (two) times daily.      Marland Kitchen  tiotropium (SPIRIVA) 18 MCG inhalation capsule Place 18 mcg into inhaler and inhale daily.      Marland Kitchen topiramate (TOPAMAX) 100 MG tablet Take 100 mg by mouth 2 (two) times daily.      . traZODone (DESYREL) 100 MG tablet Take 300 mg by mouth at bedtime.      . ziprasidone (GEODON) 80 MG capsule Take 80 mg by mouth 2 (two) times daily with a meal.       No current facility-administered  medications on file prior to visit.     Past Medical History  Diagnosis Date  . Enlarged heart   . Hypothyroidism   . Asthma   . History of shingles   . Cellulitis   . Diabetes mellitus without complication   . History of fall   . Bronchitis   . Cancer of cervix   . Schizoaffective disorder   . COPD (chronic obstructive pulmonary disease)   . Complication of anesthesia     hard to wake up  . Chest pain     with exertion  . Heart murmur   . Pneumonia     numerous times in Jan. 2013  . Varicose veins   . Cramps, extremity   . Anemia   . Cataracts, bilateral   . Sleep apnea      Past Surgical History  Procedure Laterality Date  . Gallbladder surgery    . Gastric bypass    . Cervical surgery    . Breast lumpectomy    . Cesarean section    . Cholecystectomy    . Tubal ligation    . Subxyphoid pericardial window N/A 12/19/2012    Procedure: SUBXYPHOID PERICARDIAL WINDOW;  Surgeon: Kerin Perna, MD;  Location: [facility] OR;  Service: Thoracic;  Laterality: N/A;  . Intraoperative transesophageal echocardiogram N/A 12/19/2012    Procedure: INTRAOPERATIVE TRANSESOPHAGEAL ECHOCARDIOGRAM;  Surgeon: Kerin Perna, MD;  Location: [facility] OR;  Service: Thoracic;  Laterality: N/A;     Family History  Problem Relation Age of Onset  . Family history unknown: Yes     History   Social History  . Marital Status: Married    Spouse Name: N/A    Number of Children: N/A  . Years of Education: N/A   Occupational History  . Not on file.   Social History Main Topics  . Smoking status: Former Smoker -- 1.00 packs/day for 20 years    Types: Cigarettes  . Smokeless tobacco: Never Used  . Alcohol Use: No  . Drug Use: No  . Sexual Activity: Not on file   Other Topics Concern  . Not on file   Social History Narrative  . No narrative on file      PHYSICAL EXAM   BP 120/72  Pulse 72  Ht 5' 2.5" (1.588 m)  Wt 240 lb 12 oz (109.203 kg)  BMI 43.30 kg/m2 Constitutional: She is  oriented to person, place, and time. She appears well-developed and well-nourished. No distress.  HENT: No nasal discharge.  Head: Normocephalic and atraumatic.  Eyes: Pupils are equal and round. No discharge.  Neck: Normal range of motion. Neck supple. No JVD present. No thyromegaly present.  Cardiovascular: Normal rate, regular rhythm, normal heart sounds. Exam reveals no gallop and no friction rub. No murmur heard.  Pulmonary/Chest: Effort normal and breath sounds normal. No stridor. No respiratory distress. She has no wheezes. She has no rales. She exhibits no tenderness.  Abdominal: Soft. Bowel sounds are normal. She exhibits no  distension. There is no tenderness. There is no rebound and no guarding.  Musculoskeletal: Normal range of motion. She exhibits no edema and no tenderness.  Neurological: She is alert and oriented to person, place, and time. Coordination normal.  Skin: Skin is warm and dry. No rash noted. She is not diaphoretic. No erythema. No pallor.  Psychiatric: She has a normal mood and affect. Her behavior is normal. Judgment and thought content normal.     EKG: Normal sinus rhythm with left posterior fascicular block   ASSESSMENT AND PLAN

## 2013-04-08 NOTE — Assessment & Plan Note (Signed)
The chest pain does not have any anginal features. Continue observation for now.

## 2013-04-08 NOTE — Assessment & Plan Note (Signed)
She is being treated with simvastatin 10 mg once daily which is managed by her primary care physician.

## 2013-04-08 NOTE — Assessment & Plan Note (Signed)
Blood pressure is well controlled on current medications. 

## 2013-04-08 NOTE — Assessment & Plan Note (Signed)
I will plan on repeating an echocardiogram in 6 months from now to evaluate the pericardium and ensure no constrictive pericarditis given that the effusion was very fibrinous.

## 2013-11-04 ENCOUNTER — Telehealth: Payer: Self-pay | Admitting: *Deleted

## 2013-11-04 NOTE — Telephone Encounter (Signed)
Lmom to call our office recall reminder.

## 2013-11-20 ENCOUNTER — Ambulatory Visit: Payer: Medicare Other | Admitting: Cardiovascular Disease

## 2013-12-10 ENCOUNTER — Encounter: Payer: Self-pay | Admitting: Cardiovascular Disease

## 2013-12-10 ENCOUNTER — Ambulatory Visit (INDEPENDENT_AMBULATORY_CARE_PROVIDER_SITE_OTHER): Payer: PRIVATE HEALTH INSURANCE | Admitting: Cardiovascular Disease

## 2013-12-10 ENCOUNTER — Ambulatory Visit: Payer: Medicare Other | Admitting: Cardiovascular Disease

## 2013-12-10 VITALS — BP 120/75 | HR 81 | Ht 62.5 in | Wt 245.8 lb

## 2013-12-10 DIAGNOSIS — R0602 Shortness of breath: Secondary | ICD-10-CM

## 2013-12-10 DIAGNOSIS — I319 Disease of pericardium, unspecified: Secondary | ICD-10-CM

## 2013-12-10 DIAGNOSIS — I313 Pericardial effusion (noninflammatory): Secondary | ICD-10-CM

## 2013-12-10 DIAGNOSIS — I10 Essential (primary) hypertension: Secondary | ICD-10-CM | POA: Diagnosis not present

## 2013-12-10 DIAGNOSIS — I3139 Other pericardial effusion (noninflammatory): Secondary | ICD-10-CM

## 2013-12-10 NOTE — Progress Notes (Signed)
HPI  This is a 68 year old female who is here today for followup visit regarding pericardial effusion. She has PMHx of recurrent pericardial effusions, schizophrenia, DM2, HTN, HLD, morbid obesity, h/o tobacco abuse, h/o cervical CA and hypothyroidism . In July of 2014, an outpatient echocardiogram was performed to evaluate her dyspnea which revealed moderate-sized pericardial effusion. She was treated conservatively. However, repeat echocardiogram showed persistent moderate-sized pericardial effusion, no evidence of tamponade, normal EF, but thick, fibrinous material was appreciated. Symptoms included chest pain and DOE.  Connective tissue work-up negative. PPD returned positive- f/u sputum test at the health department returned negative x 3. Negative HIV and syphilis.  Given persistent fibrinous, moderate pericardial effusion, ongoing symptoms and risk of constrictive pericardial effusion, the patient was referred to [facility] for consideration of pericardial window for therapeutic and diagnostic purposes.  She underwent pericardial window in September, 2014.  Pericardial biopsy revealed benign mesothelial lined fibroadipose tissue. Pericardial fluid revealed reactive mesothelial cells. Culture indicated no evidence of infection (no acid fast bacilli included). She was discharged 12/23/12 and presents today for follow-up.  She has been doing well and denies any recurrent chest pain. Dyspnea is stable. No dizziness.  No Known Allergies   Current Outpatient Prescriptions on File Prior to Visit  Medication Sig Dispense Refill  . albuterol (PROVENTIL HFA;VENTOLIN HFA) 108 (90 BASE) MCG/ACT inhaler Inhale 2 puffs into the lungs every 6 (six) hours as needed for wheezing.      . calcitRIOL (ROCALTROL) 0.25 MCG capsule Take 0.25 mcg by mouth 3 (three) times a week. Monday, Wednesday, and friday      . Cholecalciferol (VITAMIN D3) 50000 UNITS CAPS Take 50,000 Units by mouth every 14 (fourteen) days.       .  clonazePAM (KLONOPIN) 1 MG tablet Take 1 mg by mouth at bedtime.       . fluticasone (FLONASE) 50 MCG/ACT nasal spray Place 1 spray into the nose daily.       . Fluticasone-Salmeterol (ADVAIR) 100-50 MCG/DOSE AEPB Inhale 1 puff into the lungs every 12 (twelve) hours.      Marland Kitchen levothyroxine (SYNTHROID, LEVOTHROID) 137 MCG tablet Take 137 mcg by mouth daily before breakfast.      . Liraglutide (VICTOZA) 18 MG/3ML SOPN Inject 1 application into the skin once a week.       . lubiprostone (AMITIZA) 24 MCG capsule Take 24 mcg by mouth 2 (two) times daily with a meal.      . metoprolol tartrate (LOPRESSOR) 12.5 mg TABS Take 12.5 mg by mouth 2 (two) times daily.      . montelukast (SINGULAIR) 10 MG tablet Take 10 mg by mouth at bedtime.       Marland Kitchen QUEtiapine (SEROQUEL) 400 MG tablet Take 400 mg by mouth at bedtime.       . simvastatin (ZOCOR) 10 MG tablet Take 10 mg by mouth at bedtime.      Marland Kitchen tiotropium (SPIRIVA) 18 MCG inhalation capsule Place 18 mcg into inhaler and inhale daily.      Marland Kitchen topiramate (TOPAMAX) 100 MG tablet Take 100 mg by mouth 2 (two) times daily.      . traZODone (DESYREL) 100 MG tablet Take 300 mg by mouth at bedtime.      . ziprasidone (GEODON) 80 MG capsule Take 80 mg by mouth 2 (two) times daily with a meal.       No current facility-administered medications on file prior to visit.     Past Medical History  Diagnosis  Date  . Enlarged heart   . Hypothyroidism   . Asthma   . History of shingles   . Cellulitis   . Diabetes mellitus without complication   . History of fall   . Bronchitis   . Cancer of cervix   . Schizoaffective disorder   . COPD (chronic obstructive pulmonary disease)   . Complication of anesthesia     hard to wake up  . Chest pain     with exertion  . Heart murmur   . Pneumonia     numerous times in Jan. 2013  . Varicose veins   . Cramps, extremity   . Anemia   . Cataracts, bilateral   . Sleep apnea      Past Surgical History  Procedure  Laterality Date  . Gallbladder surgery    . Gastric bypass    . Cervical surgery    . Breast lumpectomy    . Cesarean section    . Cholecystectomy    . Tubal ligation    . Subxyphoid pericardial window N/A 12/19/2012    Procedure: SUBXYPHOID PERICARDIAL WINDOW;  Surgeon: Ivin Poot, MD;  Location: [place];  Service: Thoracic;  Laterality: N/A;  . Intraoperative transesophageal echocardiogram N/A 12/19/2012    Procedure: INTRAOPERATIVE TRANSESOPHAGEAL ECHOCARDIOGRAM;  Surgeon: Ivin Poot, MD;  Location: [facility] OR;  Service: Thoracic;  Laterality: N/A;     Family History  Problem Relation Age of Onset  . Family history unknown: Yes     History   Social History  . Marital Status: Married    Spouse Name: N/A    Number of Children: N/A  . Years of Education: N/A   Occupational History  . Not on file.   Social History Main Topics  . Smoking status: Former Smoker -- 1.00 packs/day for 20 years    Types: Cigarettes  . Smokeless tobacco: Never Used  . Alcohol Use: No  . Drug Use: No  . Sexual Activity: Not on file   Other Topics Concern  . Not on file   Social History Narrative  . No narrative on file      PHYSICAL EXAM   BP 120/75  Pulse 81  Ht 5' 2.5" (1.588 m)  Wt 245 lb 12 oz (111.471 kg)  BMI 44.20 kg/m2 Constitutional: She is oriented to person, place, and time. She appears well-developed and well-nourished. No distress.  HENT: No nasal discharge.  Head: Normocephalic and atraumatic.  Eyes: Pupils are equal and round. No discharge.  Neck: Normal range of motion. Neck supple. No JVD present. No thyromegaly present.  Cardiovascular: Normal rate, regular rhythm, normal heart sounds. Exam reveals no gallop and no friction rub. No murmur heard.  Pulmonary/Chest: Effort normal and breath sounds normal. No stridor. No respiratory distress. She has no wheezes. She has no rales. She exhibits no tenderness.  Abdominal: Soft. Bowel sounds are normal. She exhibits no  distension. There is no tenderness. There is no rebound and no guarding.  Musculoskeletal: Normal range of motion. She exhibits no edema and no tenderness.  Neurological: She is alert and oriented to person, place, and time. Coordination normal.  Skin: Skin is warm and dry. No rash noted. She is not diaphoretic. No erythema. No pallor.  Psychiatric: She has a normal mood and affect. Her behavior is normal. Judgment and thought content normal.     EKG: Sinus  Rhythm  Low voltage -possible pulmonary disease.   ABNORMAL    ASSESSMENT AND PLAN

## 2013-12-10 NOTE — Assessment & Plan Note (Signed)
She is status post pericardial window and seems to be overall stable. She did have small residual pericardial effusion with fibrinous material and thus she is at risk for constrictive pericarditis. I requested an echocardiogram for evaluation. If that comes back unremarkable, no further cardiac followup as needed.

## 2013-12-10 NOTE — Patient Instructions (Signed)
Your physician has requested that you have an echocardiogram. Echocardiography is a painless test that uses sound waves to create images of your heart. It provides your doctor with information about the size and shape of your heart and how well your heart's chambers and valves are working. This procedure takes approximately one hour. There are no restrictions for this procedure.  Follow up as needed 

## 2013-12-25 ENCOUNTER — Other Ambulatory Visit: Payer: Self-pay

## 2013-12-25 ENCOUNTER — Other Ambulatory Visit (INDEPENDENT_AMBULATORY_CARE_PROVIDER_SITE_OTHER): Payer: PRIVATE HEALTH INSURANCE

## 2013-12-25 DIAGNOSIS — I319 Disease of pericardium, unspecified: Secondary | ICD-10-CM

## 2013-12-25 DIAGNOSIS — I3139 Other pericardial effusion (noninflammatory): Secondary | ICD-10-CM

## 2013-12-25 DIAGNOSIS — I313 Pericardial effusion (noninflammatory): Secondary | ICD-10-CM

## 2013-12-25 DIAGNOSIS — R0602 Shortness of breath: Secondary | ICD-10-CM

## 2014-02-02 ENCOUNTER — Ambulatory Visit: Payer: Self-pay | Admitting: Ophthalmology

## 2014-02-02 LAB — HEMOGLOBIN: HGB: 11 g/dL — ABNORMAL LOW (ref 12.0–16.0)

## 2014-02-16 ENCOUNTER — Ambulatory Visit: Payer: Self-pay | Admitting: Ophthalmology

## 2014-03-05 ENCOUNTER — Ambulatory Visit: Payer: Self-pay | Admitting: Family Medicine

## 2014-04-26 ENCOUNTER — Ambulatory Visit: Payer: Self-pay | Admitting: Ophthalmology

## 2014-04-26 LAB — HEMOGLOBIN: HGB: 11.2 g/dL — ABNORMAL LOW (ref 12.0–16.0)

## 2014-05-04 ENCOUNTER — Ambulatory Visit: Payer: Self-pay | Admitting: Ophthalmology

## 2014-07-12 DIAGNOSIS — F4310 Post-traumatic stress disorder, unspecified: Secondary | ICD-10-CM | POA: Insufficient documentation

## 2014-08-06 NOTE — H&P (Signed)
PATIENT NAME:  Heather Compton, Heather Compton MR#:  [id number] DATE OF BIRTH:  02/24/1946  DATE OF ADMISSION:  10/11/2012  PRIMARY CARE PHYSICIAN:  Dr. Johny Drilling.   REFERRING PHYSICIAN:  Dr. Mariea Clonts.  CHIEF COMPLAINT:  Chest pain and shortness of breath.   HISTORY OF PRESENT ILLNESS:  The patient is a 68 year old female with a past medical history of diabetes mellitus, hypertension, hyperlipidemia, schizophrenia and asthma, is sent over to the ER from [facility] after she had an echocardiogram done and diagnosed with pericardial effusion.  The patient has been having intermittent episodes of chest pain associated with shortness of breath and some dry cough for the past six months and was seen by her primary care physician and she was sent over to [facility] to get echocardiogram done.  Echocardiogram has revealed pericardial effusion, but we do not have official report as reported by the ER physician.  The patient was sent over to the ER and she has received Lasix 40 mg IV.  ER physician has discussed with Dr. Fletcher Anon the cardiologist on call who has recommended no treatment at this time, but recommended echocardiogram in a.m.   PAST MEDICAL HISTORY:  Diabetes mellitus, hypertension, hyperlipidemia, asthma, schizophrenia and cardiomegaly.   PAST SURGICAL HISTORY:  Cholecystectomy, gastric bypass.   ALLERGIES:  No known drug allergies.   HOME MEDICATIONS:  Trazodone 100 mg 3 tablets once a day, topiramate 100 mg 2 times a day, temazepam 15 mg 2 times a day, Spiriva 18 mcg inhalation 1 capsule via HandiHaler once daily, simvastatin 10 mg once daily, Quetiapine 400 mg 2 tablets once a day, levothyroxine 137 mcg once daily, clonazepam 1 mg 2 tablets once daily, Amitiza 24 mcg 1 capsule by mouth 2 times a day, Advair 100 mcg 1 puff inhalation 2 times a day, calcitriol 0.25 mcg once daily, clonazepam 1 mg 2 tablets once a day, vitamin D3 1 capsule every two weeks, Victoza 1.8 mg once daily.    PSYCHOSOCIAL HISTORY:  Lives at home with husband.  She used to smoke, but quit smoking.  Denies any alcohol or illicit drug usage.   FAMILY HISTORY:  Both parents and brother deceased with heart attack.   REVIEW OF SYSTEMS:  CONSTITUTIONAL:  Denies any fever, no fatigue.  Complaining of intermittent episodes of chest pain associated with shortness of breath for the past six months.  No weight loss or weight gain.  EYES:  Denies any blurry vision, glaucoma, cataracts.  EARS, NOSE, THROAT:  Denies any epistaxis, discharge, snoring, postnasal drip.  RESPIRATION:  Complaining of dry cough intermittently for the past 6 months.  No COPD.  Denies any hemoptysis.  CARDIOVASCULAR:  Intermittent episodes of chest pain.  No palpitations, syncope.  GASTROINTESTINAL:  Denies nausea, vomiting, diarrhea, GERD.  GENITOURINARY:  No dysuria or hematuria.  GYNECOLOGIC AND BREAST:  No breast mass or vaginal discharge.  History of cervical cancer was present.  ENDOCRINOLOGY:  Hypothyroidism.  No polyuria, nocturia.  No increased thirst.  HEMATOLOGIC AND LYMPHATIC:  No anemia, easy bruising or bleeding.  INTEGUMENTARY:  No acne, rash, lesions.  MUSCULOSKELETAL:  Denies any joint pain, neck pain, back pain.  Denies any gout.  NEUROLOGIC:  Denies any vertigo, ataxia, dementia, headache.  PSYCHIATRIC:  Has schizophrenia.  Denies any ADD, OCD, bipolar disorder.   PHYSICAL EXAMINATION:  VITAL SIGNS:  Temperature 97.6, pulse 74, respirations 16, blood pressure 117/78, pulse ox 96%.  GENERAL APPEARANCE:  Not under any acute distress.  Moderately built and  obese.  HEENT:  Normocephalic, atraumatic.  Pupils are equally reacting to light and accommodation.  Extraocular movements are intact.  No scleral icterus.  No sinus tenderness.  No postnasal drip.  No pharyngeal exudates.  NECK:  Supple.  No JVD.  No thyromegaly.  No lymphadenopathy.  LUNGS:  Clear to auscultation except for end expiratory wheezing at bases only.   No accessory muscle usage.  No anterior chest wall tenderness on palpation.  CARDIOVASCULAR:  S1 and S2 normal.  Regular rate and rhythm.  No gallops.  No pericardial rubs.  No murmurs.  GASTROINTESTINAL:  Soft.  Bowel sounds are positive in all four quadrants.  Obese, nontender, nondistended.  No hepatosplenomegaly.  NEUROLOGIC:  Awake, alert, oriented x 3.  Sluggish response to verbal commands, but follows verbal commands and answers questions appropriately.  Cranial nerves II through XII are intact.  Motor and sensory are intact.  Reflexes are 2+.  EXTREMITIES:  No edema.  No cyanosis.  No clubbing.  SKIN:  Warm to touch.  Normal turgor.  No lesions.  No rashes.  MUSCULOSKELETAL:  No joint effusion, tenderness or erythema.  PSYCHIATRIC:  Normal mood, flat affect.   LABORATORY AND IMAGING STUDIES:  Chest x-ray has revealed cardiomegaly with enlarged cardiac silhouette, left CP angle is obliterated, prominent vasculature is present.  Glucose 243, BNP 102, BUN 18, creatinine 1.25, sodium 139, potassium 4.0, chloride 110, CO2 21, anion gap is 8.  GFR is 45, serum osmolality 287, calcium 7.9.  Troponin less than 0.02.  WBC 6.0, hemoglobin is 11.1, hematocrit 33.6, platelets 217, MCV 100.  A 12-lead EKG has revealed sinus rhythm at 86 beats per minute.  Normal PR and QRS interval, low-voltage QRS, normal QT and corrected QT intervals.  No acute ST-T wave changes.   ASSESSMENT AND PLAN:  A 69 year old female with intermittent episodes of chest pain and shortness of breath for the past 6 months was sent over to the Emergency Room from [facility] after echocardiogram which has revealed a pericardial effusion, will be admitted with the following assessment and plan.  1.  Chest pain with shortness of breath for the past 6 months which is intermittent in nature, slowly getting worse probably from developing pericardial effusion.  No JVD.  No pericardial rub at this time.  Not under any acute distress.   We will put her on telemetry.  We will obtain an echocardiogram in a.m.  Cardiology consult is placed to Dr. Fletcher Anon.  We will cycle cardiac biomarkers.  The patient will be on acute coronary syndrome protocol as patient is complaining of intermittent episodes of chest pain.  She will be on oxygen, nitroglycerin, aspirin and statin.  Low-dose beta-blocker will be provided.  We will give her sublingual nitroglycerin as needed basis.  2.  Diabetes mellitus.  We will put her on sliding scale insulin.  3.  Hypertension.  Resume her home medications.  4.  Hyperlipidemia.  Continue statin.  5.  Schizophrenia.  Resume home medications.  6.  Asthma.  Resume her inhalers, not under acute distress at this time.  7.  We will provide GI and DVT prophylaxis.  8.  CODE STATUS:  She is FULL CODE.  Husband is POA.   Total time spent on the admission is 45 minutes.   The plan of care was discussed with the patient.  She is aware of the plan.     ____________________________ Nicholes Mango, MD ag:ea D: 10/11/2012 00:48:15 ET T: 10/11/2012 01:57:56 ET  JOB#: [id number]  cc: Nicholes Mango, MD, <Dictator> Nicholes Mango MD ELECTRONICALLY SIGNED 10/13/2012 2:43

## 2014-08-06 NOTE — Consult Note (Signed)
General Aspect Reason for consultation:  Pericardial effusion/chest pain   Present Illness The patient was sent from [facility] yesterday after an echocardiogram demonstrated pericardial effusion.  We do not have this report.  She was sent to have this done apparently because of chest pain and dyspnea.  She does not report any prior cardiac history.  However, she has had COPD and progressive dyspnea over years.  She doesn't report weight gain or edema.  However, she has had chest pain for about three months.  She says that this is constant.  It is sharp and mid chest.  She does not report associated N/V.  She does sleep on three pillows.  She says that the pain comes on with wheezing she she moves about her house but again says that it happens at rest.  She does report cough with green sputum which she reports is chronic.  She denies any weight gain or edema.  She has had no lightheadedness or syncope.  PMH:   COPD, schizophrenia, asthma, DM (x 40 year), hyperlipidemia,  PSH:  Gastric bypass, cholecystectomy, abalation of cervical cancer Social:  Quit tobacco 17 years ago,  lives with husband, she is from [place] FH:  Brother died of MI age 64, mother died of MI age 58   Physical Exam:  GEN well developed   HEENT PERRL, moist oral mucosa   NECK No masses  thyroid not tender   RESP normal resp effort  clear BS  no use of accessory muscles   CARD Regular rate and rhythm  Normal, S1, S2  No murmur  No rubs   ABD denies tenderness  denies Flank Tenderness  no liver/spleen enlargement  normal BS  no Adominal Mass   LYMPH negative neck, negative axillae   EXTR negative cyanosis/clubbing, negative edema   SKIN No rashes   NEURO cranial nerves intact, motor/sensory function intact   PSYCH alert, A+O to time, place, person   Review of Systems:  Subjective/Chief Complaint Positive for constipation and previous GI bleed.   Review of Systems: All other systems were reviewed and found to  be negative   Medications/Allergies Reviewed Medications/Allergies reviewed   Home Medications: Medication Instructions Status  topiramate 100 mg oral tablet 1 tab(s) orally 2 times a day Active  simvastatin 10 mg oral tablet 1 tab(s) orally once a day (at bedtime) Active  Amitiza 24 mcg oral capsule 1 cap(s) orally 2 times a day with food. Active  Spiriva 18 mcg inhalation capsule 1 cap(s) via handihaler once a day. Active  QUEtiapine 400 mg oral tablet 2 tabs (8113m) orally once a day (at bedtime). Active  traZODone 100 mg oral tablet 3 tabs (3041m orally once a day (at bedtime). Active  clonazePAM 1 mg oral tablet 2 tabs (13m46morally once a day (at bedtime). Active  Victoza 18 mg/3 mL subcutaneous solution 1.8 milligram(s) subcutaneous once a day Active  calcitriol 0.25 mcg oral capsule 1 cap(s) orally once a day Active  ziprasidone 80 mg oral capsule 1 cap(s) orally 2 times a day Active  temazepam 15 mg oral capsule 1 cap(s) orally 2 times a day Active  benztropine 1 mg oral tablet 1 tab(s) orally 2 times a day as needed. Active  Advair Diskus 100 mcg-50 mcg inhalation powder 1 puff(s) inhaled 2 times a day Active  Ventolin HFA CFC free 90 mcg/inh inhalation aerosol 2 puff(s) inhaled every 6 hours as needed for wheezing.  Active  Cheratussin AC 10 mg-100 mg/5 mL  oral syrup 7.5 milliliter(s) orally once a day (at bedtime) as needed for cough. Active  Vitamin D3 50,000 intl units oral capsule 1 cap(s) orally every 2 weeks Active  levothyroxine 137 mcg (0.137 mg) oral tablet 1 tab(s) orally once a day (in the morning) Active   Lab Results: Routine Chem:  27-Jun-14 14:53   B-Type Natriuretic Peptide ([facility]) 102 (Result(s) reported on 10 Oct 2012 at 10:35PM.)  Glucose, Serum  243  BUN 18  Creatinine (comp) 1.25  Sodium, Serum 139  Potassium, Serum 4.0  Chloride, Serum  110  CO2, Serum 21  Calcium (Total), Serum  7.9  Anion Gap 8  Osmolality (calc) 287  eGFR (African American)  53   eGFR (Non-African American)  45 (eGFR values <60m/min/1.73 m2 may be an indication of chronic kidney disease (CKD). Calculated eGFR is useful in patients with stable renal function. The eGFR calculation will not be reliable in acutely ill patients when serum creatinine is changing rapidly. It is not useful in  patients on dialysis. The eGFR calculation may not be applicable to patients at the low and high extremes of body sizes, pregnant women, and vegetarians.)  Cardiac:  27-Jun-14 14:53   CK, Total 64  CPK-MB, Serum 1.5 (Result(s) reported on 11 Oct 2012 at 01:27AM.)  Troponin I < 0.02 (0.00-0.05 0.05 ng/mL or less: NEGATIVE  Repeat testing in 3-6 hrs  if clinically indicated. >0.05 ng/mL: POTENTIAL  MYOCARDIAL INJURY. Repeat  testing in 3-6 hrs if  clinically indicated. NOTE: An increase or decrease  of 30% or more on serial  testing suggests a  clinically important change)  Routine Hem:  27-Jun-14 14:53   WBC (CBC) 6.0  RBC (CBC)  3.37  Hemoglobin (CBC)  11.1  Hematocrit (CBC)  33.6  Platelet Count (CBC) 217 (Result(s) reported on 10 Oct 2012 at 03:08PM.)  MCV 100  MCH 33.0  MCHC 33.1  RDW  15.4   EKG:  EKG Interp. by me    No Known Allergies:    Impression PERICARDIAL EFFUSION   Plan Pericardial Effusion:  I do not yet have the results of the echo done earlier today but we will report this later this afternoon.  At present she has no evidence of tamponade, she has no symptoms consistent with this (hypotension, acute dyspnea).  She had a negative pulsus paradox on my exam.  I do not suspect that whe will require acute treatment of her effusion.  Pending the size we can consider further work up such PPD, rheumatoligic/connective tissue disease work up.  However, this is low yeild typically.    Chest pain:  This is atypical and has been chronic.  There is no objective evidence of ischemia.  However, given her long standing diabetes out patient stress perfusion  testing would be indicated.   Electronic Signatures: HMinus Breeding(MD)  (Signed 28-Jun-14 13:38)  Authored: General Aspect/Present Illness, History and Physical Exam, Review of System, Home Medications, Labs, EKG , Allergies, Impression/Plan   Last Updated: 28-Jun-14 13:38 by HMinus Breeding(MD)

## 2014-08-06 NOTE — Discharge Summary (Signed)
PATIENT NAME:  Heather Compton, SHUPERT MR#:  [id number] DATE OF BIRTH:  25-Jan-1946  DATE OF ADMISSION:  10/11/2012 DATE OF DISCHARGE:  10/11/2012  ADMITTING DIAGNOSIS:  Pericardial effusion.   DISCHARGE DIAGNOSES:  1.  Pericardial effusion, questionable acute pericarditis.  2.  Dyspnea, likely multifactorial.  3.  Chest pain, atypical, rule out coronary artery disease.  4.  History of hypothyroidism.  5.  Obesity.  6.  Diabetes mellitus for the past 40 years. 7.  Hypertension.  8.  Hyperlipidemia.  9.  Asthma.  10.  Chronic obstructive pulmonary disease.  11.  Schizophrenia.   DISCHARGE CONDITION:  Stable.   DISCHARGE MEDICATIONS:  The patient is to resume her outpatient medications which are:  1.  Topiramate 100 mg by mouth twice daily.  2.  Simvastatin 10 mg by mouth at bedtime. 3.  Amitiza 24 mcg twice daily with food.  4.  Spiriva 1 inhalation once daily. 5.  Trazodone 100 mg 3 tablets, 300 mg total, at bedtime.  6.  Clonazepam 1 mg 2 tablets which would be 2 mg at bedtime.  7.  Victoza 1.8 mg subcutaneously once daily.  8.  Calcitriol 0.25 mcg oral capsule once daily.  9.  Ziprasidone 80 mg capsule twice daily.  10.  Temazepam 15 mg by mouth twice daily.  11.  Benztropine 1 mg by mouth twice daily as needed.  12.  Advair Diskus 100/50 one puff twice daily.  13.  Ventolin HFA 2 puffs every 6 hours as needed.  14.  Cheratussin AC 10/100 in 5 mL oral syrup, 7.5 mL once at bedtime as needed.  15.  Vitamin D3 50,000 international units every two weeks.  16.  Levothyroxine 137 mcg by mouth daily.  17.  Ibuprofen 600 mg p.o. 3 times daily with meals as needed.  18.  Aspirin 325 mg by mouth once daily.  19.  Metoprolol 12.5 mg by mouth twice daily.   Ibuprofen, aspirin as well as metoprolol are new medications.   DIET:  2 gram salt, low fat, low cholesterol, carbohydrate-controlled diet, regular consistency.   ACTIVITY LIMITATIONS:  As tolerated.   FOLLOW-UP APPOINTMENT:   With Dr. Percival Spanish in two days after discharge to get outpatient stress testing done to rule out underlying coronary artery disease as well as Dr. Kym Groom in two days after discharge.  The patient was also advised to be seen by Dr. Kym Groom on Tuesday, which would be 10/14/2012, at Dr. Devona Konig office to check her PPD which was placed today on 10/11/2012, which would be 72 hours after it was placed to recommend reaction.    HOSPITAL COURSE:  The patient is a 69 year old female from [place] who presents to the hospital with complaints of chest pains.  Also, shortness of breath.  Please refer to Dr. Rinaldo Ratel admission note on 10/11/2012.  The patient was sent from [facility] for echocardiogram due to ongoing chest pain for the past few months, and apparently demonstrated pericardial effusion and she was sent to the Emergency Room for admission.  She also reported shortness of breath for years.  She, however, did not report weight gain or swelling but admitted of having some chest pains for the past three months.  Pain was described as sharp, midsternal and constant, comes on with wheezing whenever she moves around in the house, but also it happens at rest.  She also reported cough and greenish sputum, which she reported is chronic.  She required at least two pillows to sleep.  No other  symptomatology was noted.  The patient's lab data on arrival to the hospital showed elevated glucose to 243; otherwise, BMP was unremarkable.  Lipid panel was performed.  It was found LDL of 69, cholesterol level 152, triglycerides 91 and HDL 65.  Cardiac enzymes x 3 were within normal limits.  CBC was normal with white blood cell count 6.0, hemoglobin was 11.1, platelet count 217.  TSH was normal at 1.22.  The patient was admitted to the hospital for further evaluation.  Her cardiac enzymes were cycled and echocardiogram was performed on 10/11/2012.  Echocardiogram revealed a left ventricular ejection fraction by visual estimation 55%  to 60%, low-normal global left ventricular systolic function, impaired relaxation pattern of left ventricular diastolic filling, mild concentric left ventricular hypertrophy as well as moderately-sized pericardial effusion present.  Pericardial effusion was globally located around the entire heart.  The pericardial effusion appeared to contain fibrinous material which seemed organized close to right ventricle.  There was no evidence of cardiac tamponade.  There is no pleural effusion noted.  The patient was seen by cardiologist, Dr. Percival Spanish, who felt that in regards to pericardial effusion the patient should be tested with PPD as well as rheumatologic connective tissue disease work-up.  He felt that the patient had no evidence of tamponade.  She had no symptoms consistent with this such as hypotension or acute dyspnea.  She also had a negative pulse paradox on the exam, so we do not suspect that the patient's pericardial effusion will require acute treatment at this point.  In regards to chest pain, he felt it was atypical and has been chronic and there was no objective evidence of ischemia. However, due to long-standing diabetes, outpatient stress test would be indicated according to Dr. Percival Spanish.  For this reason we sent out ANA panel for rheumatologic disease, that was comprehensive panel which was sent out, and we placed PPD, which should be read by Dr. Kym Groom or his staff on 10/14/2012 in his office.  The patient also is to follow up with Dr. Clayborn Bigness in his office for outpatient stress test.  On day of discharge, the patient felt satisfactory; did complain of some discomfort in her chest intermittently as well as shortness of breath, especially on exertion.  Her vital signs were stable, however her temperature is 97.9, pulse ranging from 70s to 80s, respiratory rate was 16 to 18, blood pressure 119/74, saturation was 95% on room air at rest.   TIME SPENT:  40 minutes.    ____________________________ Theodoro Grist, MD rv:ea D: 10/11/2012 16:48:57 ET T: 10/12/2012 04:34:59 ET JOB#: [id number]  cc: Theodoro Grist, MD, <Dictator> [place], MD  [place] MD ELECTRONICALLY SIGNED 10/23/2012 11:37

## 2014-08-07 NOTE — Op Note (Signed)
PATIENT NAME:  Heather Compton, Heather Compton MR#:  [id number] DATE OF BIRTH:  [birth year]  DATE OF PROCEDURE:  02/16/2014  PREOPERATIVE DIAGNOSIS:  Nuclear sclerotic cataract of the right eye.   POSTOPERATIVE DIAGNOSIS:  Nuclear sclerotic cataract of the right eye.   OPERATIVE PROCEDURE:  Cataract extraction by phacoemulsification with implant of intraocular lens to right eye.   SURGEON:  Birder Robson, MD  ANESTHESIA:  1. Managed anesthesia care.  2. Topical tetracaine drops followed by 2% Xylocaine jelly applied in the preoperative holding area.   COMPLICATIONS:  None.   TECHNIQUE:   Stop and chop.  DESCRIPTION OF PROCEDURE:  The patient was examined and consented in the preoperative holding area where the aforementioned topical anesthesia was applied to the right eye and then brought back to the Operating Room where the right eye was prepped and draped in the usual sterile ophthalmic fashion and a lid speculum was placed. A paracentesis was created with the side port blade and the anterior chamber was filled with viscoelastic. A near clear corneal incision was performed with the steel keratome. A continuous curvilinear capsulorrhexis was performed with a cystotome followed by the capsulorrhexis forceps. Hydrodissection and hydrodelineation were carried out with BSS on a blunt cannula. The lens was removed in a stop and chop technique and the remaining cortical material was removed with the irrigation-aspiration handpiece. The capsular bag was inflated with viscoelastic and the Tecnis ZCB00 17.0-diopter lens, serial number [id number] was placed in the capsular bag without complication. The remaining viscoelastic was removed from the eye with the irrigation-aspiration handpiece. The wounds were hydrated. The anterior chamber was flushed with Miostat and the eye was inflated to physiologic pressure. 0.1 mL of cefuroxime concentration 10 mg/mL was placed in the anterior chamber. The wounds were found to be  water tight. The eye was dressed with Vigamox. The patient was given protective glasses to wear throughout the day and a shield with which to sleep tonight. The patient was also given drops with which to begin a drop regimen today and will follow-up with me in one day.    ____________________________ Livingston Diones. Larsen Zettel, MD wlp:je D: 02/16/2014 21:11:00 ET T: 02/17/2014 09:56:16 ET JOB#: [id number]  cc: Chalmer Zheng L. Alyvia Derk, MD, <Dictator> Livingston Diones Jayvien Rowlette MD ELECTRONICALLY SIGNED 02/18/2014 16:57

## 2014-08-15 NOTE — Op Note (Signed)
PATIENT NAME:  Heather Compton, NO MR#:  [id number] DATE OF BIRTH:  Sep 03, 1945  DATE OF PROCEDURE:  05/04/2014  PREOPERATIVE DIAGNOSIS:  Nuclear sclerotic cataract of the left eye.   POSTOPERATIVE DIAGNOSIS:  Nuclear sclerotic cataract of the left eye.   OPERATIVE PROCEDURE:  Cataract extraction by phacoemulsification with implant of intraocular lens to left eye.   SURGEON:  Birder Robson, MD.   ANESTHESIA:  1. Managed anesthesia care.  2. Topical tetracaine drops followed by 2% Xylocaine jelly applied in the preoperative holding area.   COMPLICATIONS:  None.   TECHNIQUE:   Stop and chop.  DESCRIPTION OF PROCEDURE:  The patient was examined and consented in the preoperative holding area where the aforementioned topical anesthesia was applied to the left eye and then brought back to the Operating Room where the left eye was prepped and draped in the usual sterile ophthalmic fashion and a lid speculum was placed. A paracentesis was created with the side port blade and the anterior chamber was filled with viscoelastic. A near clear corneal incision was performed with the steel keratome. A continuous curvilinear capsulorrhexis was performed with a cystotome followed by the capsulorrhexis forceps. Hydrodissection and hydrodelineation were carried out with BSS on a blunt cannula. The lens was removed in a stop and chop technique and the remaining cortical material was removed with the irrigation-aspiration handpiece. The capsular bag was inflated with viscoelastic and the Tecnis ZCB00 17.0 diopter lens, serial #[id number], was placed in the capsular bag without complication. The remaining viscoelastic was removed from the eye with the irrigation-aspiration handpiece. The wounds were hydrated. The anterior chamber was flushed with Miostat and the eye was inflated to physiologic pressure. 0.1 mL of cefuroxime concentration 10 mg/mL was placed in the anterior chamber. The wounds were found to be water  tight. The eye was dressed with Vigamox. The patient was given protective glasses to wear throughout the day and a shield with which to sleep tonight. The patient was also given drops with which to begin a drop regimen today and will follow-up with me in one day.    ____________________________ Livingston Diones. Joanna Borawski, MD wlp:nt D: 05/04/2014 20:52:52 ET T: 05/04/2014 23:06:22 ET JOB#: [id number]  cc: [place] Masaki L. Diondra Pines, MD, <Dictator> Livingston Diones Alvis Edgell MD ELECTRONICALLY SIGNED 05/05/2014 16:10

## 2014-09-25 IMAGING — CR DG CHEST 2V
1 series · 2 of 2 positions shown · non-contrast
Comparison: none

REASON FOR EXAM: cough
COMMENTS:

[Series 1: pa · 0.17mm/px · 2 of 2 slices shown]
[im 1/2]
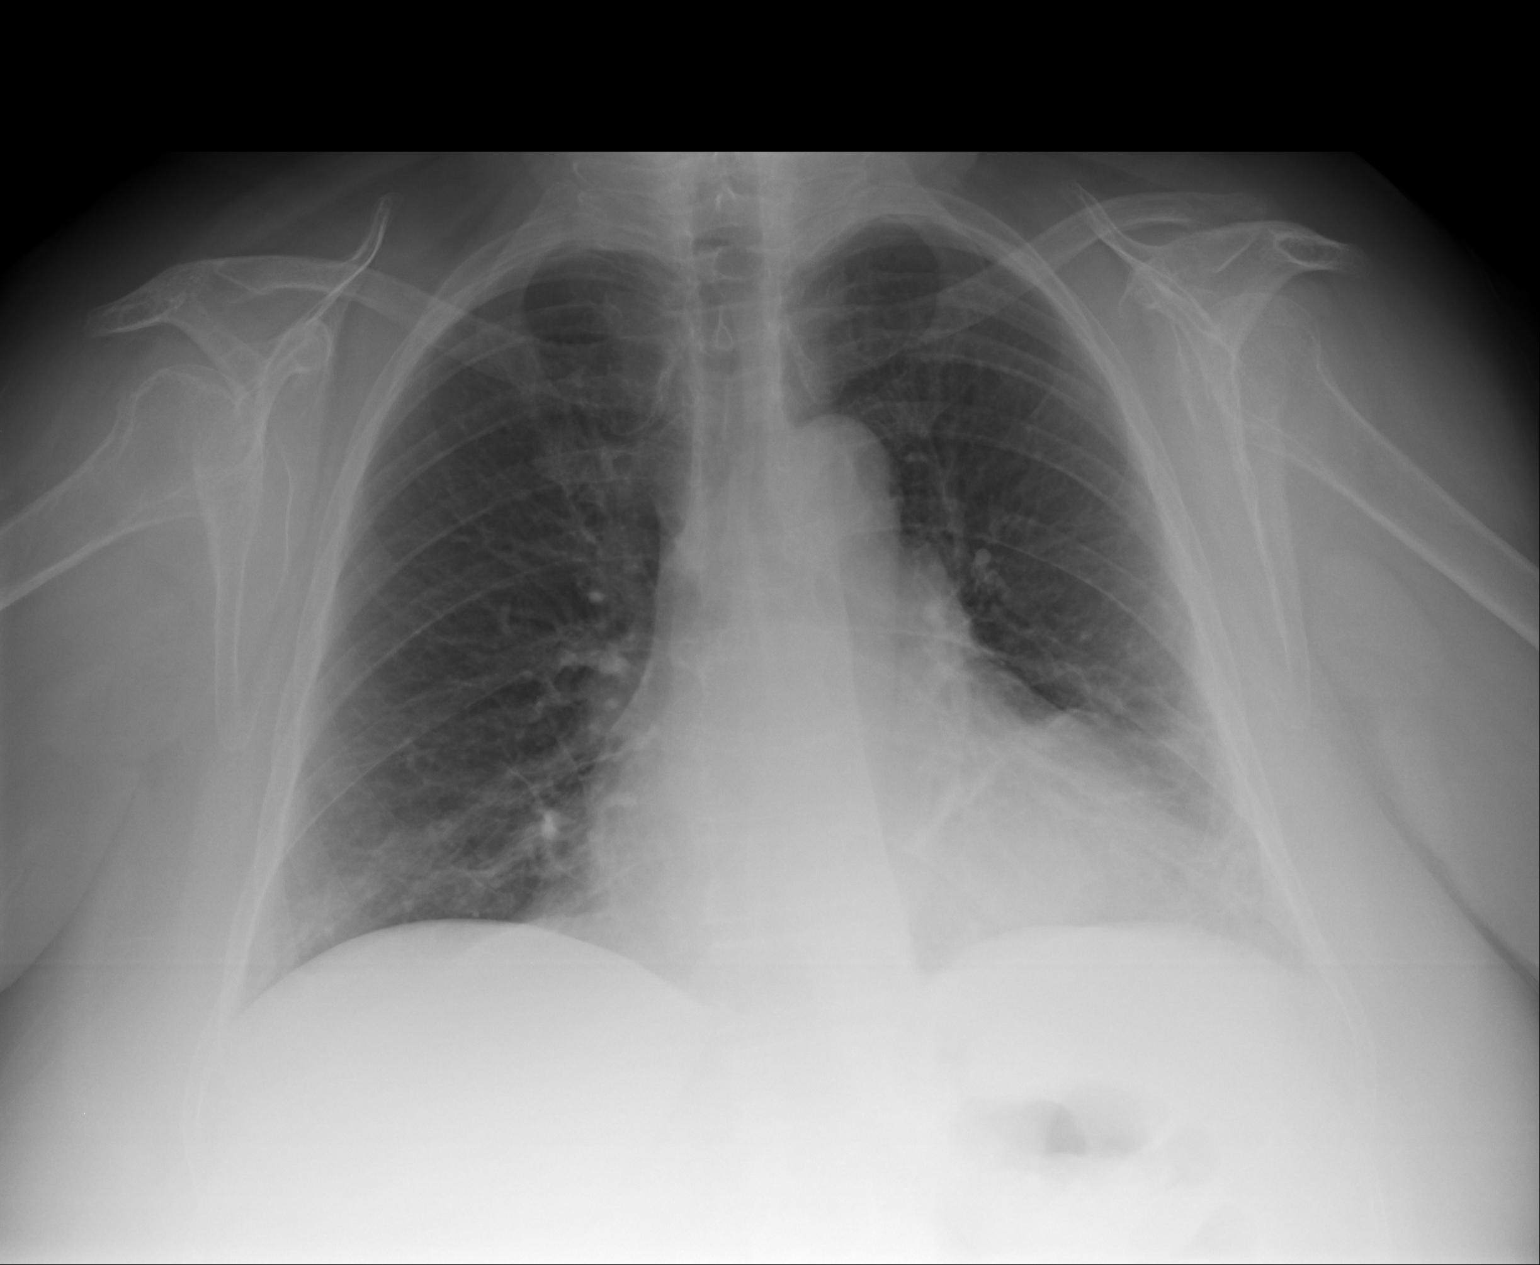
[im 2/2]
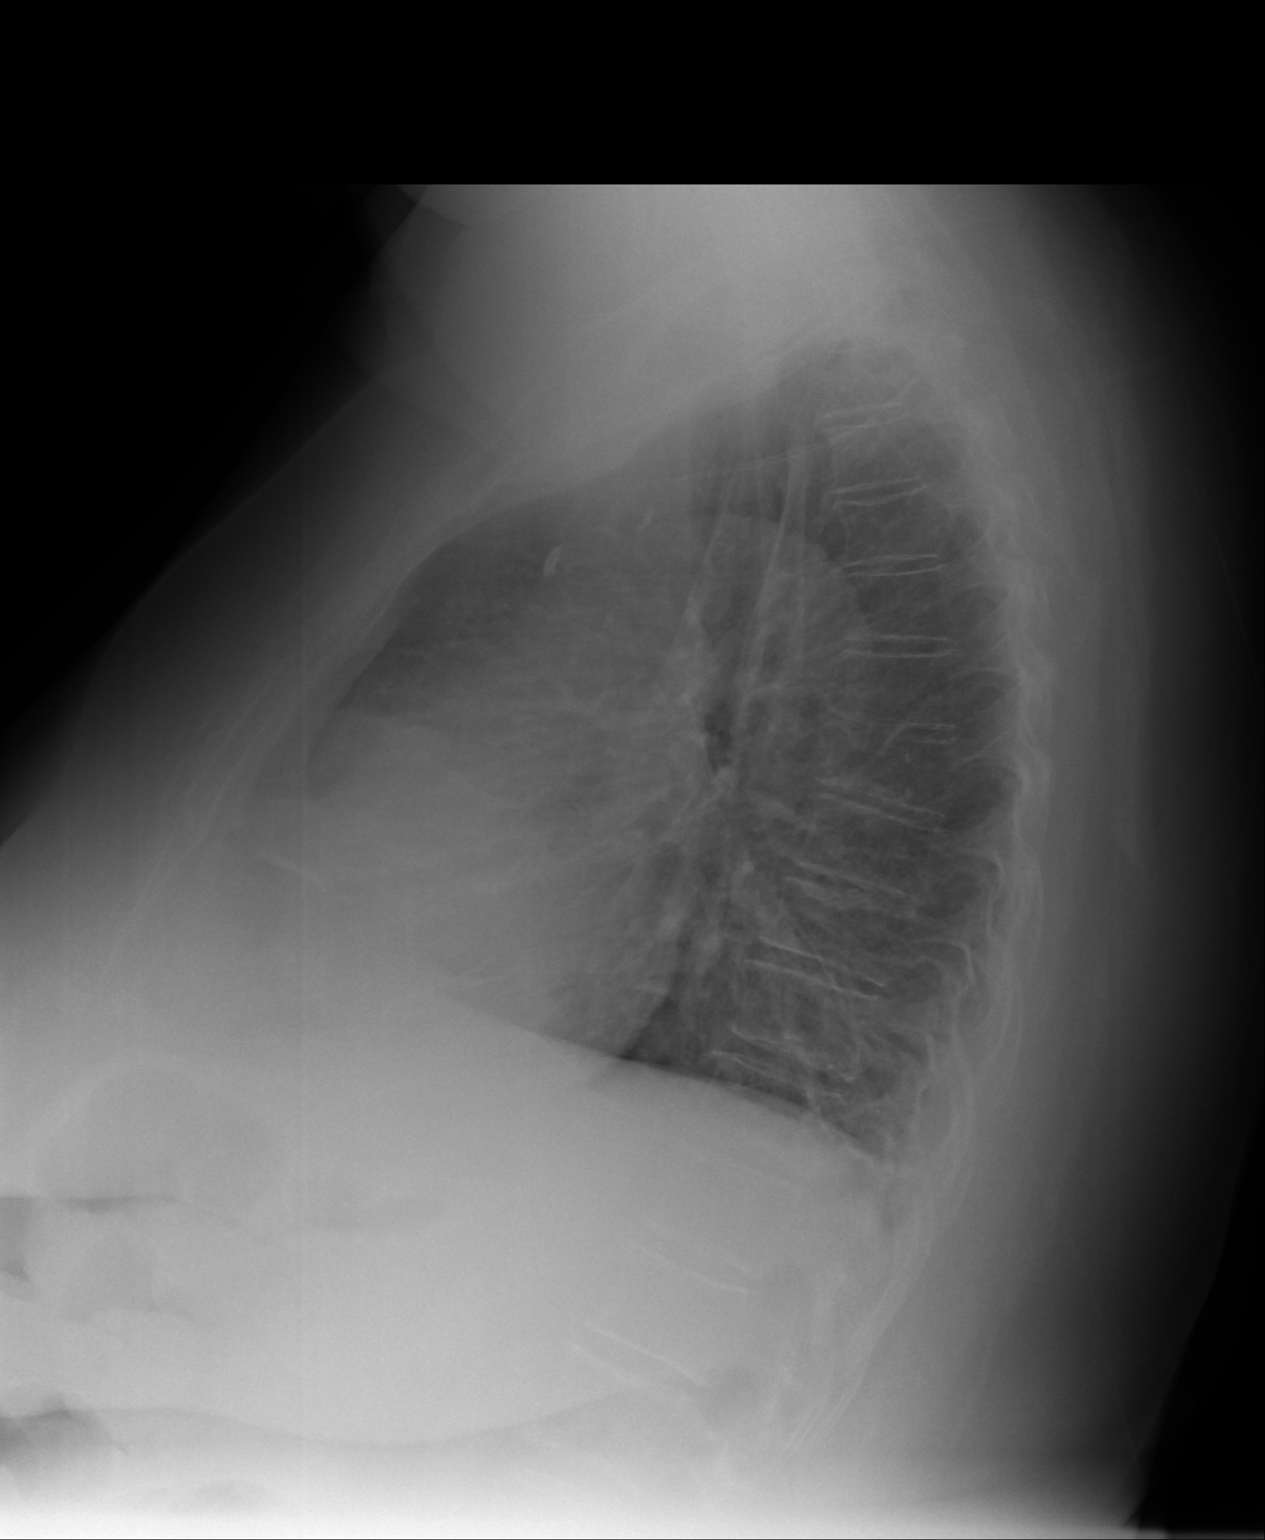

[2 of 2 positions shown; findings below may reference images not displayed]

PROCEDURE:     KDR - KDXR CHEST PA (OR AP) AND LAT  - September 10, 2012  [DATE]

RESULT:     Comparison is made to the previous exam of 05/08/2012.

The heart is enlarged but unchanged. The lungs are mildly hyperinflated.
There is no edema, effusion, mass or pneumothorax. The bony and mediastinal
structures are unremarkable.
IMPRESSION: 1. Stable cardiomegaly. No acute cardiopulmonary disease evident.

[REDACTED]

## 2015-01-02 IMAGING — CT CT CHEST W/ CM
2 of 3 series · 15 of 30 positions shown, 17 images · IV contrast (75CC OMNI 300)
Comparison: None

CLINICAL DATA: Pericardial effusion.

CT CHEST WITH CONTRAST
TECHNIQUE: Multidetector CT imaging of the chest was performed
following the standard protocol during bolus administration of
intravenous contrast.
Contrast: 75mL OMNIPAQUE IOHEXOL 300 MG/ML  SOLN

[Series 3: chest with · axial · 0.70mm/px · z∈[-300,-60]mm · 7 of 66 slices shown, 9 images]
[im 9/66  mediastinal]
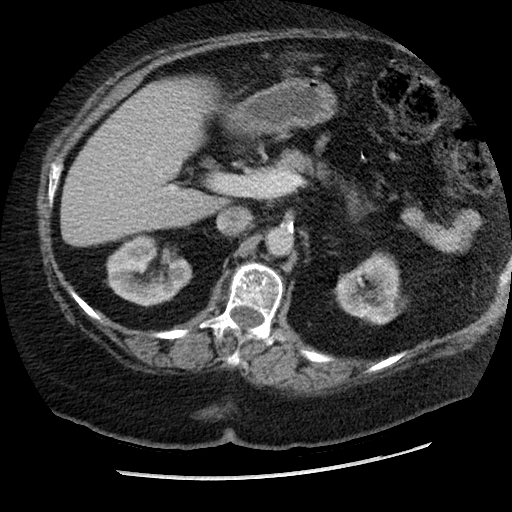
[im 9/66  lung]
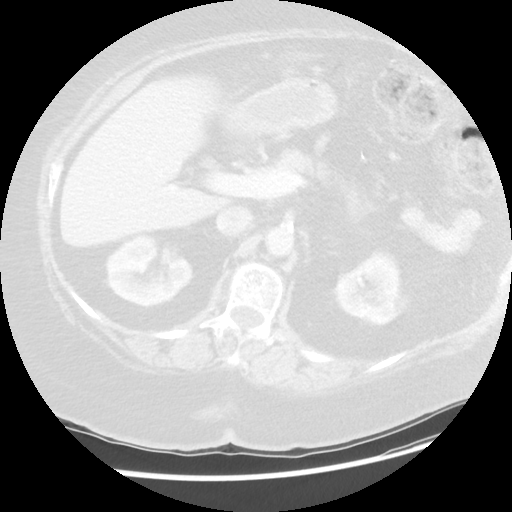
[im 17/66  lung]
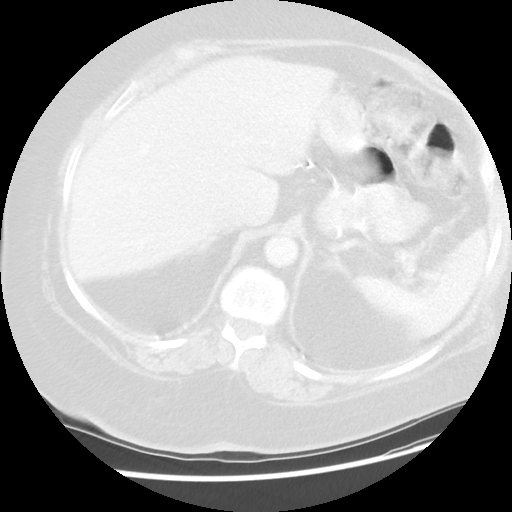
[im 25/66  lung]
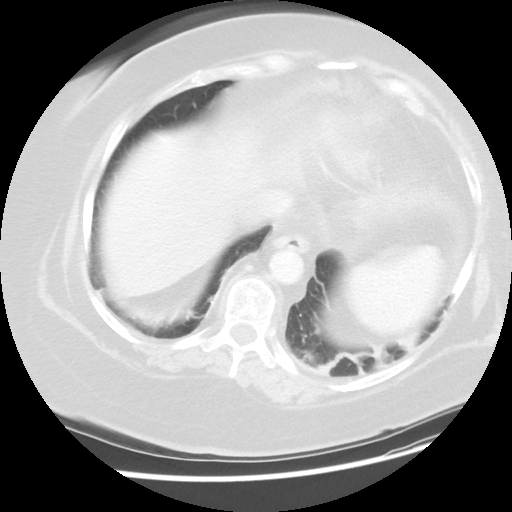
[im 33/66  lung]
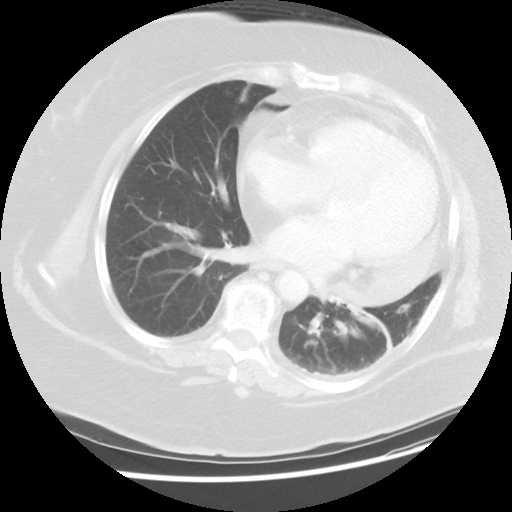
[im 41/66  mediastinal]
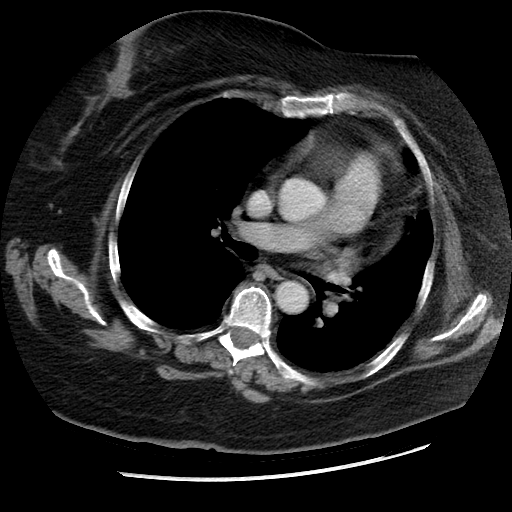
[im 41/66  lung]
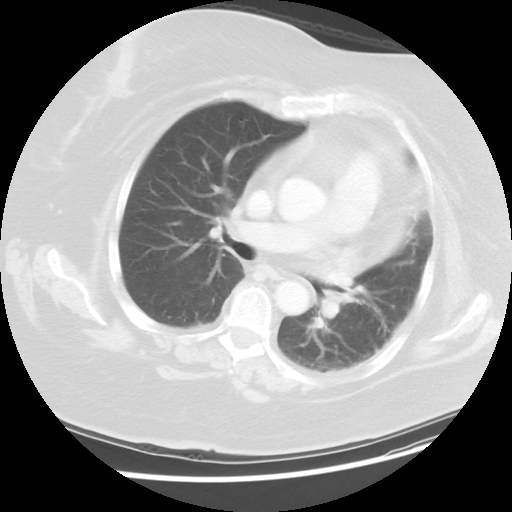
[im 49/66  lung]
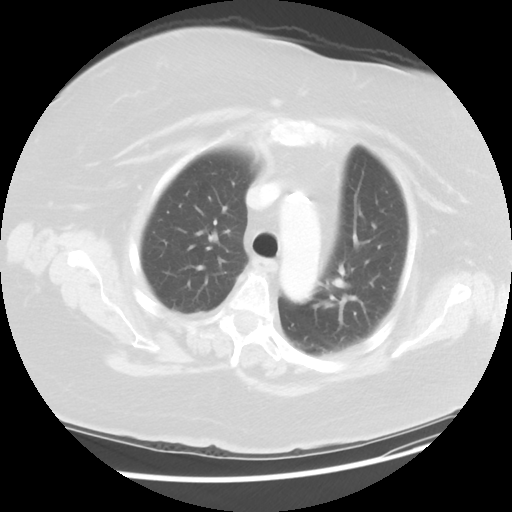
[im 57/66  lung]
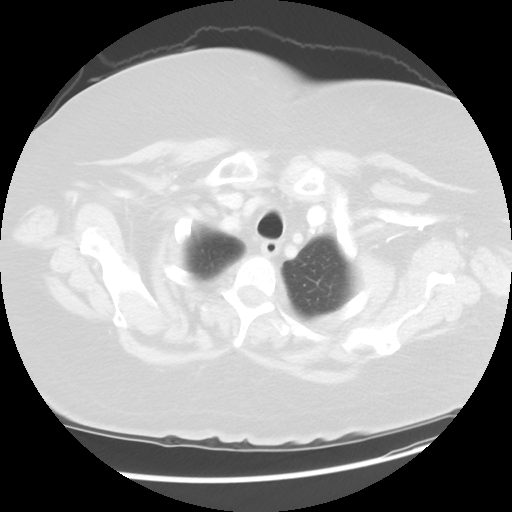

[Series 602: sagittal body · sagittal · 0.70mm/px · 8 of 145 slices shown]
[im 17/145  mediastinal]
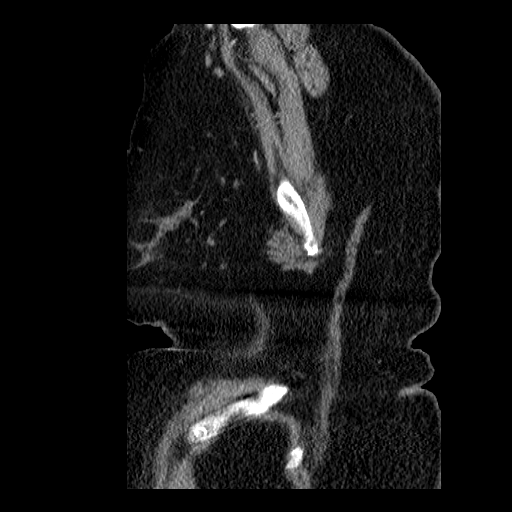
[im 33/145  mediastinal]
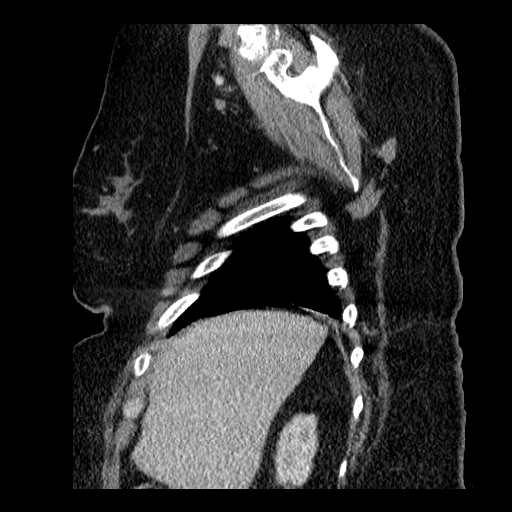
[im 49/145  mediastinal]
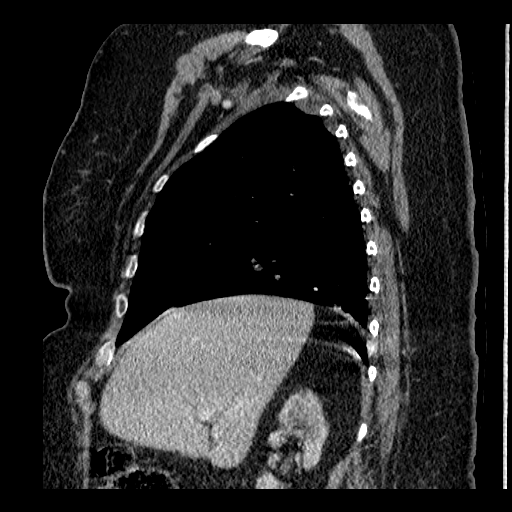
[im 65/145  mediastinal]
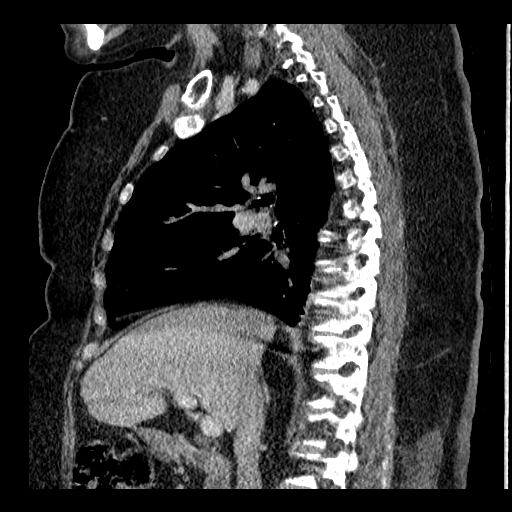
[im 81/145  mediastinal]
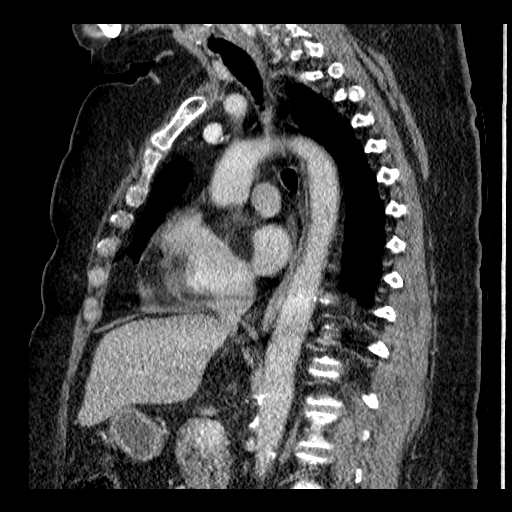
[im 97/145  mediastinal]
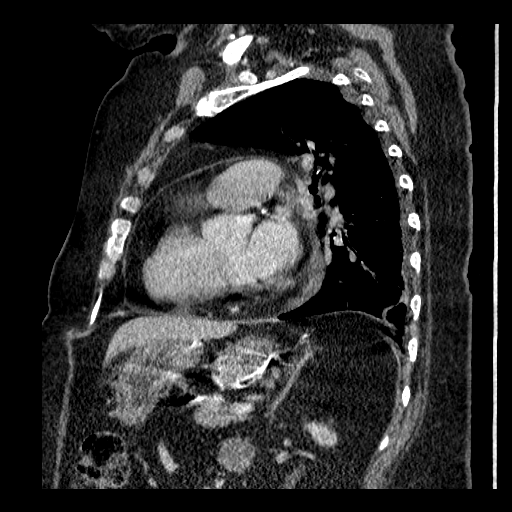
[im 113/145  mediastinal]
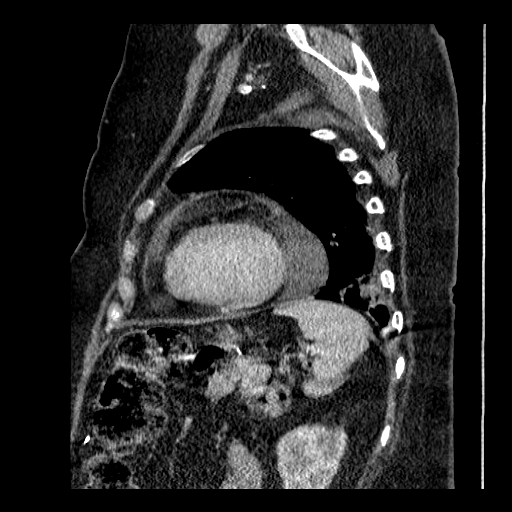
[im 129/145  mediastinal]
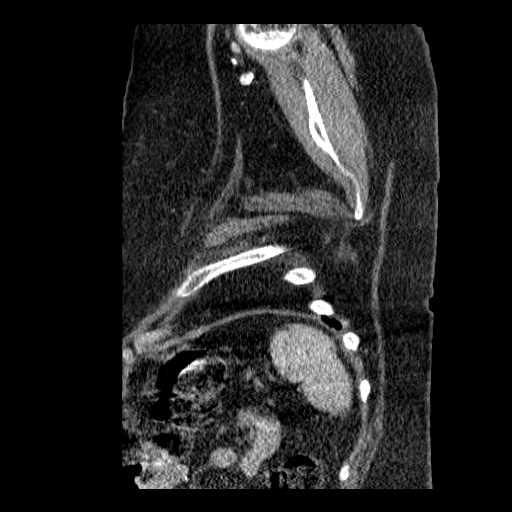

[15 of 30 positions shown; findings below may reference images not displayed]

FINDINGS: There is no pleural effusion identified.  There is
atelectasis identified in the lung bases, left greater than right.
No airspace consolidation identified.

The trachea appears patent and is midline.  The heart size appears
mildly enlarged.  There is a small pericardial effusion.  No
mediastinal or hilar adenopathy.   Calcifications involving the
thoracic aorta and left circumflex coronary artery noted.  There is
no axillary or supraclavicular adenopathy.

Limited imaging through the upper abdomen shows postoperative
change from gastric bypass surgery.  No acute findings identified.

Review of the visualized osseous structures demonstrates no
aggressive bone lesions.  There is mild spondylosis within the
thoracic spine.
IMPRESSION: 1.  Small pericardial effusion.
2.  Atelectasis is noted within both lung bases left greater than
right.
3.  Coronary artery calcifications.

## 2015-01-06 IMAGING — DX DG CHEST 1V PORT
1 series · 4 of 4 positions shown · non-contrast
Comparison: 12/20/2012

CLINICAL DATA: Status post pericardial window placement

PORTABLE CHEST - 1 VIEW

[Series 1: portable · 0.17mm/px · 4 of 4 slices shown]
[im 1/4]
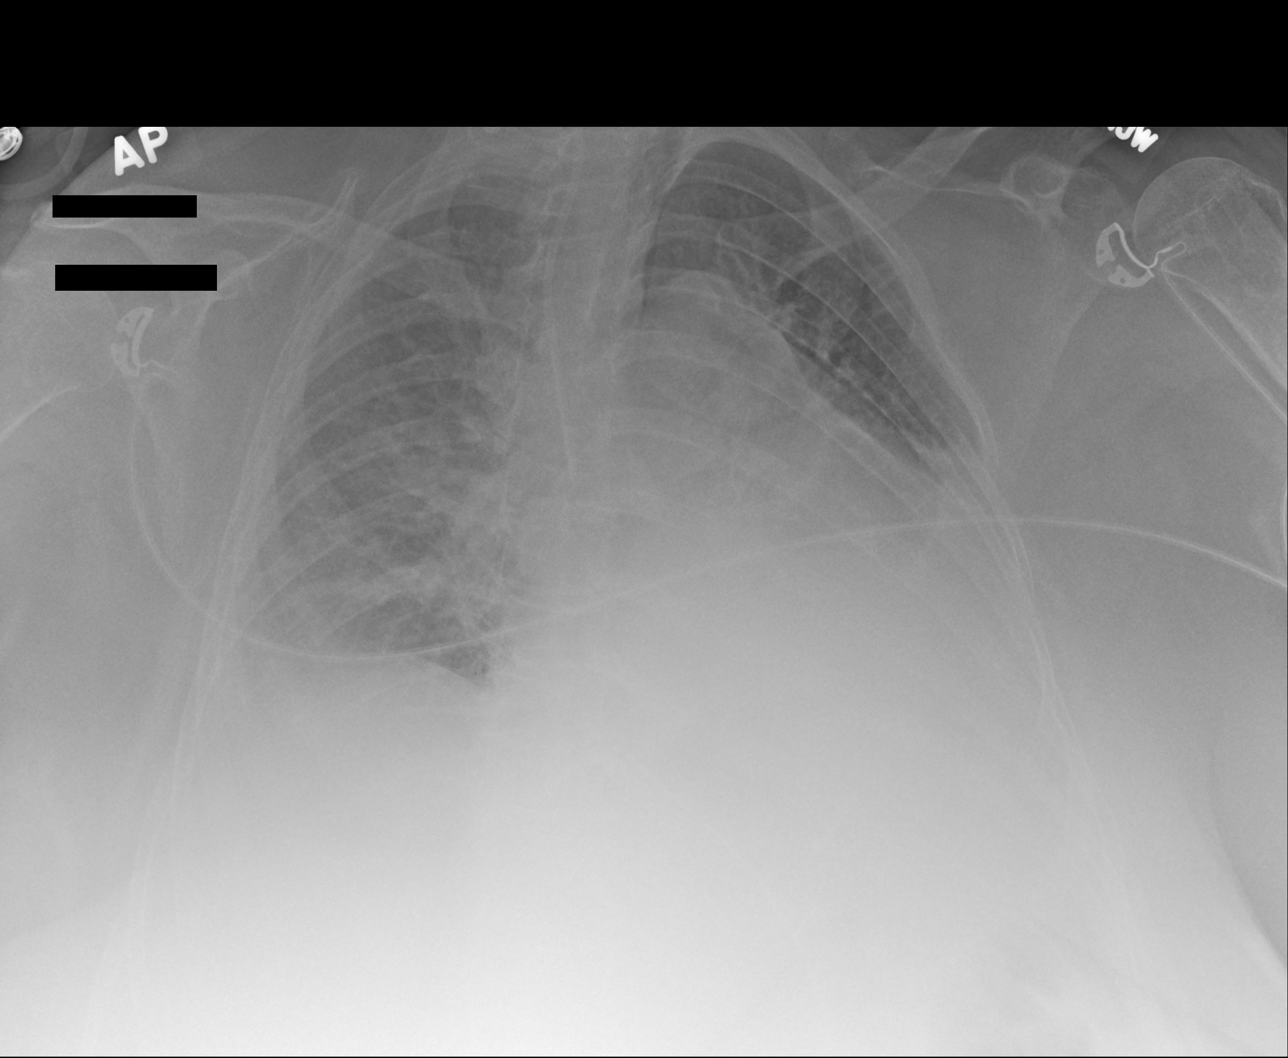
[im 2/4]
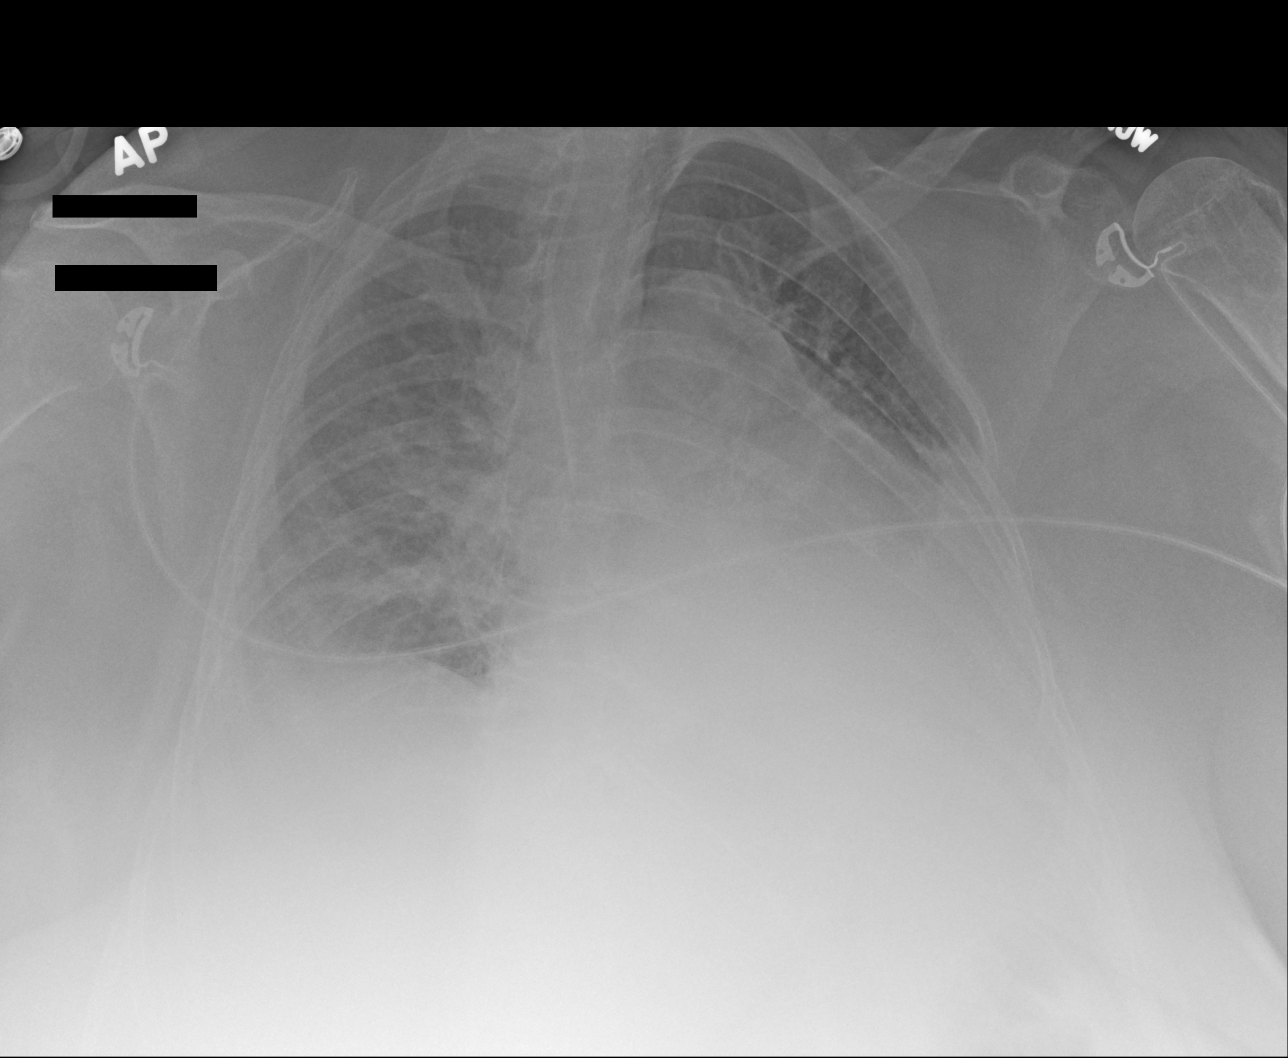
[im 3/4]
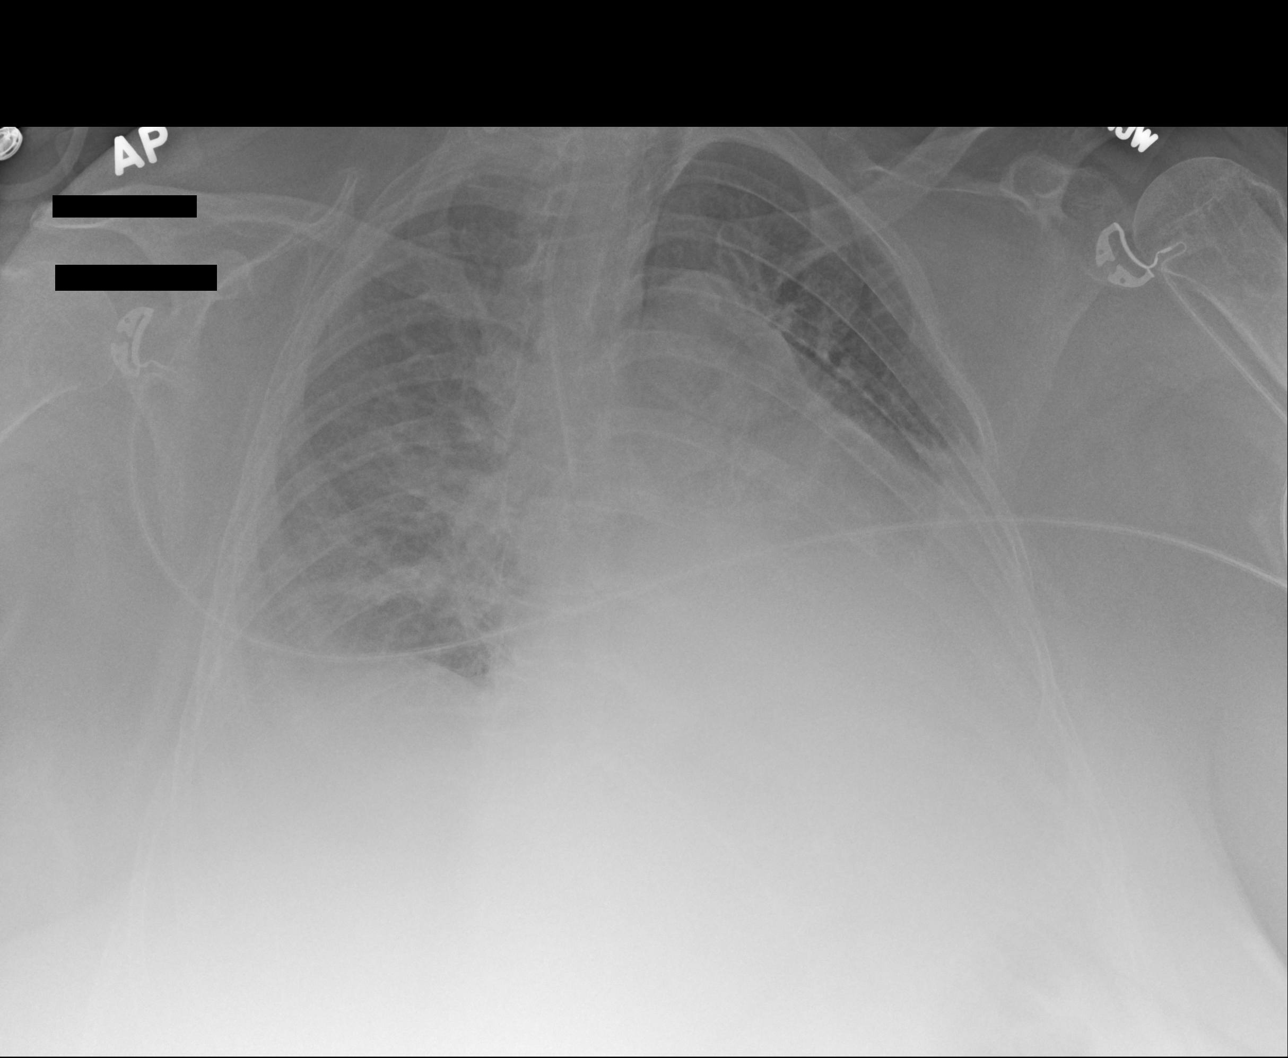
[im 4/4]
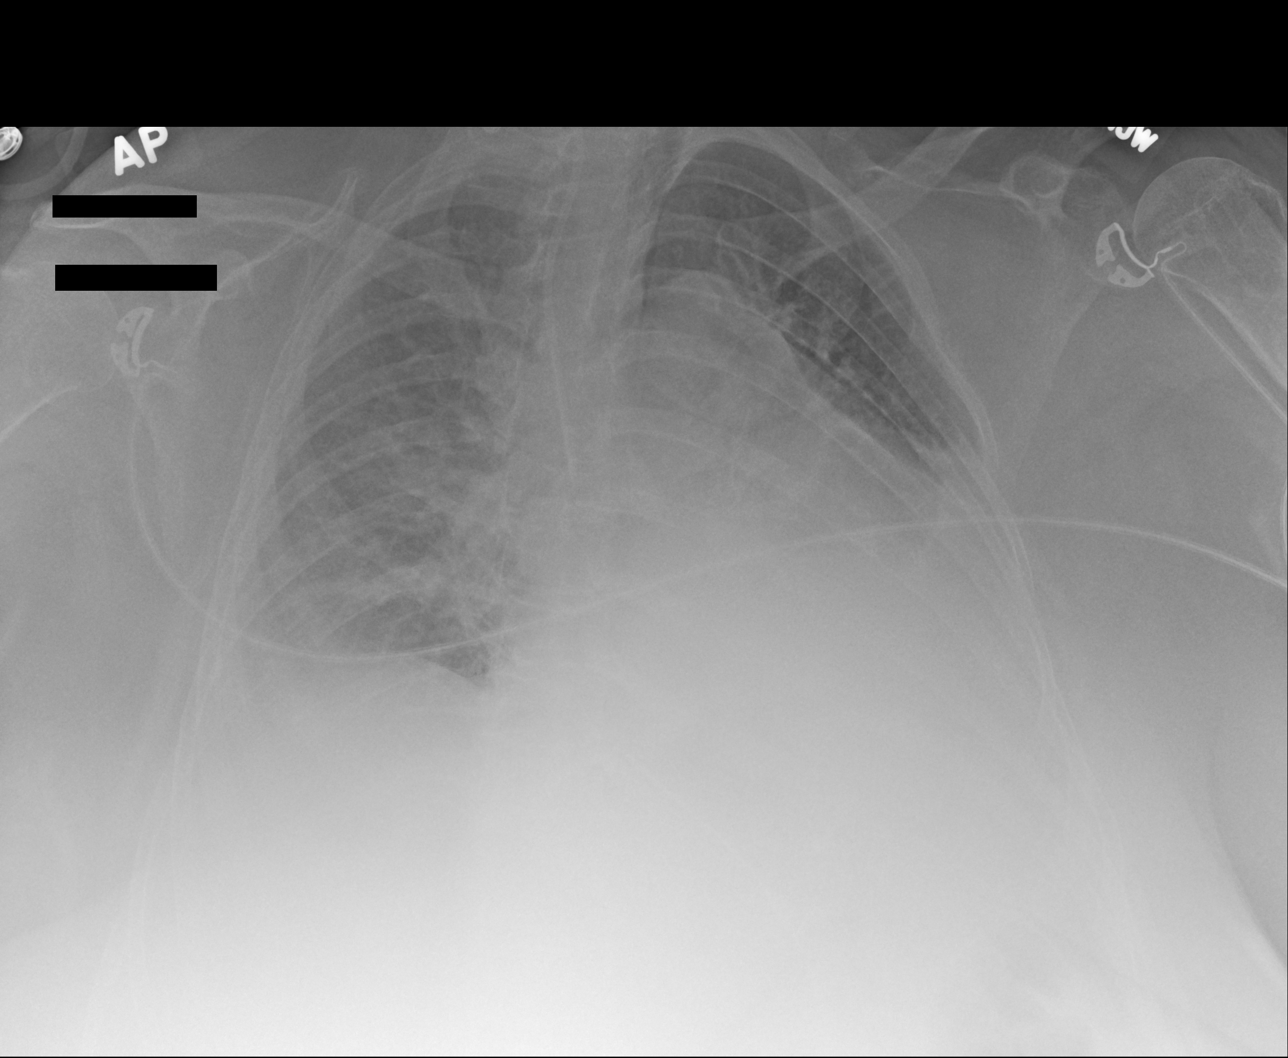

[4 of 4 positions shown; findings below may reference images not displayed]

FINDINGS: The exam is again somewhat limited by patient body
habitus.  Cardiac shadow is stable but mildly enlarged.  Right-
sided central venous line is again seen and stable.  Left-sided
pleural effusion and atelectasis is again noted.
IMPRESSION: Stable left basilar changes.  No new focal abnormality is seen.

## 2015-02-07 ENCOUNTER — Encounter: Payer: Self-pay | Admitting: *Deleted

## 2015-02-08 ENCOUNTER — Encounter
Admission: RE | Disposition: A | Discharge status: Home / Self Care | Payer: Self-pay | Source: Ambulatory Visit | Attending: Internal Medicine

## 2015-02-08 ENCOUNTER — Encounter: Payer: Self-pay | Admitting: *Deleted

## 2015-02-08 ENCOUNTER — Ambulatory Visit
Admission: RE | Admit: 2015-02-08 | Discharge: 2015-02-08 | Disposition: A | Discharge status: Home / Self Care | Payer: Medicare (Managed Care) | Source: Ambulatory Visit | Attending: Internal Medicine | Admitting: Internal Medicine

## 2015-02-08 DIAGNOSIS — Z9884 Bariatric surgery status: Secondary | ICD-10-CM | POA: Diagnosis not present

## 2015-02-08 DIAGNOSIS — Z8541 Personal history of malignant neoplasm of cervix uteri: Secondary | ICD-10-CM | POA: Insufficient documentation

## 2015-02-08 DIAGNOSIS — R0602 Shortness of breath: Secondary | ICD-10-CM | POA: Insufficient documentation

## 2015-02-08 DIAGNOSIS — R079 Chest pain, unspecified: Secondary | ICD-10-CM | POA: Diagnosis not present

## 2015-02-08 DIAGNOSIS — E039 Hypothyroidism, unspecified: Secondary | ICD-10-CM | POA: Diagnosis not present

## 2015-02-08 DIAGNOSIS — Z8489 Family history of other specified conditions: Secondary | ICD-10-CM | POA: Insufficient documentation

## 2015-02-08 DIAGNOSIS — Z79899 Other long term (current) drug therapy: Secondary | ICD-10-CM | POA: Insufficient documentation

## 2015-02-08 DIAGNOSIS — Z8249 Family history of ischemic heart disease and other diseases of the circulatory system: Secondary | ICD-10-CM | POA: Diagnosis not present

## 2015-02-08 DIAGNOSIS — E785 Hyperlipidemia, unspecified: Secondary | ICD-10-CM | POA: Diagnosis not present

## 2015-02-08 DIAGNOSIS — F410 Panic disorder [episodic paroxysmal anxiety] without agoraphobia: Secondary | ICD-10-CM | POA: Insufficient documentation

## 2015-02-08 DIAGNOSIS — F329 Major depressive disorder, single episode, unspecified: Secondary | ICD-10-CM | POA: Insufficient documentation

## 2015-02-08 DIAGNOSIS — Z7951 Long term (current) use of inhaled steroids: Secondary | ICD-10-CM | POA: Insufficient documentation

## 2015-02-08 DIAGNOSIS — Z9049 Acquired absence of other specified parts of digestive tract: Secondary | ICD-10-CM | POA: Diagnosis not present

## 2015-02-08 DIAGNOSIS — F419 Anxiety disorder, unspecified: Secondary | ICD-10-CM | POA: Insufficient documentation

## 2015-02-08 DIAGNOSIS — E119 Type 2 diabetes mellitus without complications: Secondary | ICD-10-CM | POA: Diagnosis not present

## 2015-02-08 DIAGNOSIS — G4733 Obstructive sleep apnea (adult) (pediatric): Secondary | ICD-10-CM | POA: Diagnosis not present

## 2015-02-08 DIAGNOSIS — F519 Sleep disorder not due to a substance or known physiological condition, unspecified: Secondary | ICD-10-CM | POA: Insufficient documentation

## 2015-02-08 DIAGNOSIS — J45909 Unspecified asthma, uncomplicated: Secondary | ICD-10-CM | POA: Insufficient documentation

## 2015-02-08 HISTORY — DX: Depression, unspecified: F32.A

## 2015-02-08 HISTORY — DX: Unspecified osteoarthritis, unspecified site: M19.90

## 2015-02-08 HISTORY — DX: Anxiety disorder, unspecified: F41.9

## 2015-02-08 HISTORY — PX: CARDIAC CATHETERIZATION: SHX172

## 2015-02-08 HISTORY — DX: Panic disorder (episodic paroxysmal anxiety): F41.0

## 2015-02-08 HISTORY — DX: Malignant neoplasm of cervix uteri, unspecified: C53.9

## 2015-02-08 HISTORY — DX: Major depressive disorder, single episode, unspecified: F32.9

## 2015-02-08 HISTORY — DX: Hyperlipidemia, unspecified: E78.5

## 2015-02-08 LAB — GLUCOSE, CAPILLARY: Glucose-Capillary: 90 mg/dL (ref 65–99)

## 2015-02-08 SURGERY — RIGHT AND LEFT HEART CATH
Anesthesia: Moderate Sedation | Laterality: Bilateral

## 2015-02-08 MED ORDER — SODIUM CHLORIDE 0.9 % IJ SOLN: 3.0000 mL | INTRAMUSCULAR | Status: DC | PRN

## 2015-02-08 MED ORDER — FENTANYL CITRATE (PF) 100 MCG/2ML IJ SOLN
INTRAMUSCULAR | Status: AC
  Filled 2015-02-08: qty 2

## 2015-02-08 MED ORDER — HEPARIN (PORCINE) IN NACL 2-0.9 UNIT/ML-% IJ SOLN
INTRAMUSCULAR | Status: AC
  Filled 2015-02-08: qty 1000

## 2015-02-08 MED ORDER — SODIUM CHLORIDE 0.9 % IJ SOLN: 3.0000 mL | Freq: Two times a day (BID) | INTRAMUSCULAR | Status: DC

## 2015-02-08 MED ORDER — SODIUM CHLORIDE 0.9 % WEIGHT BASED INFUSION: 3.0000 mL/kg/h | INTRAVENOUS | Status: DC

## 2015-02-08 MED ORDER — IOHEXOL 300 MG/ML  SOLN
INTRAMUSCULAR | Status: DC | PRN
  Administered 2015-02-08: 110 mL via INTRA_ARTERIAL

## 2015-02-08 MED ORDER — FENTANYL CITRATE (PF) 100 MCG/2ML IJ SOLN
INTRAMUSCULAR | Status: DC | PRN
  Administered 2015-02-08: 50 ug via INTRAVENOUS

## 2015-02-08 MED ORDER — SODIUM CHLORIDE 0.9 % IV SOLN: 250.0000 mL | INTRAVENOUS | Status: DC | PRN

## 2015-02-08 MED ORDER — ONDANSETRON HCL 4 MG/2ML IJ SOLN: 4.0000 mg | Freq: Four times a day (QID) | INTRAMUSCULAR | Status: DC | PRN

## 2015-02-08 MED ORDER — SODIUM CHLORIDE 0.9 % IV SOLN
INTRAVENOUS | Status: DC
  Administered 2015-02-08: 12:00:00 via INTRAVENOUS

## 2015-02-08 MED ORDER — ACETAMINOPHEN 325 MG PO TABS: 650.0000 mg | ORAL_TABLET | ORAL | Status: DC | PRN

## 2015-02-08 SURGICAL SUPPLY — 12 items
CATH INFINITI 5FR ANG PIGTAIL (CATHETERS) ×3 IMPLANT
CATH INFINITI 5FR JL4 (CATHETERS) ×3 IMPLANT
CATH INFINITI JR4 5F (CATHETERS) ×3 IMPLANT
CATH SWANZ 7F THERMO (CATHETERS) ×3 IMPLANT
DEVICE CLOSURE MYNXGRIP 5F (Vascular Products) ×3 IMPLANT
KIT MANI 3VAL PERCEP (MISCELLANEOUS) ×3 IMPLANT
KIT RIGHT HEART (MISCELLANEOUS) ×3 IMPLANT
NEEDLE PERC 18GX7CM (NEEDLE) ×3 IMPLANT
PACK CARDIAC CATH (CUSTOM PROCEDURE TRAY) ×3 IMPLANT
SHEATH AVANTI 5FR X 11CM (SHEATH) ×3 IMPLANT
SHEATH PINNACLE 7F 10CM (SHEATH) ×3 IMPLANT
WIRE EMERALD 3MM-J .035X150CM (WIRE) ×3 IMPLANT

## 2015-02-08 NOTE — Discharge Instructions (Signed)

## 2015-04-27 ENCOUNTER — Other Ambulatory Visit: Payer: Self-pay | Admitting: Chiropractor

## 2015-04-27 ENCOUNTER — Ambulatory Visit
Admission: RE | Admit: 2015-04-27 | Discharge: 2015-04-27 | Disposition: A | Discharge status: Home / Self Care | Payer: PRIVATE HEALTH INSURANCE | Source: Ambulatory Visit | Attending: Chiropractor | Admitting: Chiropractor

## 2015-04-27 DIAGNOSIS — R0602 Shortness of breath: Secondary | ICD-10-CM | POA: Insufficient documentation

## 2016-12-04 ENCOUNTER — Other Ambulatory Visit: Payer: Self-pay | Admitting: Family Medicine

## 2016-12-04 DIAGNOSIS — I420 Dilated cardiomyopathy: Secondary | ICD-10-CM

## 2016-12-18 ENCOUNTER — Ambulatory Visit
Admission: RE | Admit: 2016-12-18 | Discharge: 2016-12-18 | Disposition: A | Discharge status: Home / Self Care | Payer: Medicare (Managed Care) | Source: Ambulatory Visit | Attending: Family Medicine | Admitting: Family Medicine

## 2016-12-18 DIAGNOSIS — J440 Chronic obstructive pulmonary disease with acute lower respiratory infection: Secondary | ICD-10-CM | POA: Insufficient documentation

## 2016-12-18 DIAGNOSIS — E119 Type 2 diabetes mellitus without complications: Secondary | ICD-10-CM | POA: Diagnosis not present

## 2016-12-18 DIAGNOSIS — F419 Anxiety disorder, unspecified: Secondary | ICD-10-CM | POA: Diagnosis not present

## 2016-12-18 DIAGNOSIS — J40 Bronchitis, not specified as acute or chronic: Secondary | ICD-10-CM | POA: Diagnosis not present

## 2016-12-18 DIAGNOSIS — E039 Hypothyroidism, unspecified: Secondary | ICD-10-CM | POA: Insufficient documentation

## 2016-12-18 DIAGNOSIS — I313 Pericardial effusion (noninflammatory): Secondary | ICD-10-CM | POA: Diagnosis not present

## 2016-12-18 DIAGNOSIS — E785 Hyperlipidemia, unspecified: Secondary | ICD-10-CM | POA: Diagnosis not present

## 2016-12-18 DIAGNOSIS — J189 Pneumonia, unspecified organism: Secondary | ICD-10-CM | POA: Insufficient documentation

## 2016-12-18 DIAGNOSIS — D649 Anemia, unspecified: Secondary | ICD-10-CM | POA: Diagnosis not present

## 2016-12-18 DIAGNOSIS — I081 Rheumatic disorders of both mitral and tricuspid valves: Secondary | ICD-10-CM | POA: Diagnosis not present

## 2016-12-18 DIAGNOSIS — I420 Dilated cardiomyopathy: Secondary | ICD-10-CM | POA: Diagnosis present

## 2016-12-18 DIAGNOSIS — G4730 Sleep apnea, unspecified: Secondary | ICD-10-CM | POA: Insufficient documentation

## 2016-12-18 NOTE — Progress Notes (Signed)
*  PRELIMINARY RESULTS* Echocardiogram 2D Echocardiogram has been performed.  Heather Compton 12/18/2016, 10:48 AM

## 2017-07-11 ENCOUNTER — Other Ambulatory Visit: Payer: Self-pay | Admitting: Family Medicine

## 2017-07-11 DIAGNOSIS — R1011 Right upper quadrant pain: Secondary | ICD-10-CM

## 2017-07-12 ENCOUNTER — Ambulatory Visit
Admission: RE | Admit: 2017-07-12 | Discharge: 2017-07-12 | Disposition: A | Discharge status: Home / Self Care | Payer: Medicare (Managed Care) | Source: Ambulatory Visit | Attending: Family Medicine | Admitting: Family Medicine

## 2017-07-12 DIAGNOSIS — R1011 Right upper quadrant pain: Secondary | ICD-10-CM | POA: Insufficient documentation

## 2017-08-14 ENCOUNTER — Ambulatory Visit: Payer: Medicare (Managed Care) | Admitting: Gastroenterology

## 2017-08-15 DIAGNOSIS — S92309A Fracture of unspecified metatarsal bone(s), unspecified foot, initial encounter for closed fracture: Secondary | ICD-10-CM | POA: Insufficient documentation

## 2017-10-10 ENCOUNTER — Encounter: Payer: Self-pay | Admitting: Gastroenterology

## 2017-10-10 ENCOUNTER — Ambulatory Visit (INDEPENDENT_AMBULATORY_CARE_PROVIDER_SITE_OTHER): Payer: Medicare (Managed Care) | Admitting: Gastroenterology

## 2017-10-10 ENCOUNTER — Encounter (INDEPENDENT_AMBULATORY_CARE_PROVIDER_SITE_OTHER): Payer: Self-pay

## 2017-10-10 VITALS — BP 115/72 | HR 83 | Ht 63.0 in | Wt 228.0 lb

## 2017-10-10 DIAGNOSIS — Z8541 Personal history of malignant neoplasm of cervix uteri: Secondary | ICD-10-CM | POA: Insufficient documentation

## 2017-10-10 DIAGNOSIS — F32A Depression, unspecified: Secondary | ICD-10-CM | POA: Insufficient documentation

## 2017-10-10 DIAGNOSIS — F419 Anxiety disorder, unspecified: Secondary | ICD-10-CM | POA: Insufficient documentation

## 2017-10-10 DIAGNOSIS — E119 Type 2 diabetes mellitus without complications: Secondary | ICD-10-CM | POA: Insufficient documentation

## 2017-10-10 DIAGNOSIS — G4730 Sleep apnea, unspecified: Secondary | ICD-10-CM | POA: Insufficient documentation

## 2017-10-10 DIAGNOSIS — R1011 Right upper quadrant pain: Secondary | ICD-10-CM

## 2017-10-10 DIAGNOSIS — F410 Panic disorder [episodic paroxysmal anxiety] without agoraphobia: Secondary | ICD-10-CM | POA: Insufficient documentation

## 2017-10-10 DIAGNOSIS — F329 Major depressive disorder, single episode, unspecified: Secondary | ICD-10-CM | POA: Insufficient documentation

## 2017-10-10 DIAGNOSIS — M1990 Unspecified osteoarthritis, unspecified site: Secondary | ICD-10-CM | POA: Insufficient documentation

## 2017-10-10 DIAGNOSIS — J45909 Unspecified asthma, uncomplicated: Secondary | ICD-10-CM | POA: Insufficient documentation

## 2017-10-10 MED ORDER — OMEPRAZOLE 40 MG PO CPDR
40.0000 mg | DELAYED_RELEASE_CAPSULE | Freq: Every day | ORAL | 3 refills | Status: DC
Start: 2017-10-10 — End: 2023-08-19

## 2017-10-10 NOTE — Patient Instructions (Signed)
1. Call Central Scheduling at [phone].  2. Take Linzess 172mcg once a day 30 minutes prior to eating.  3. Pick-up order of Prilosec from pharmacy.

## 2017-10-10 NOTE — Progress Notes (Signed)
Jonathon Bellows MD, MRCP(U.K) [address]  [place]  [place], [place] [address]  Main: [phone]  Fax: [phone]   Gastroenterology Consultation  Referring Provider:     Gareth Morgan, MD Primary Care Physician:  Gareth Morgan, MD Primary Gastroenterologist:  Dr. Jonathon Bellows  Reason for Consultation:     RUQ pain         HPI:   Heather Compton is a 72 y.o. y/o female referred for consultation & management  by Dr. Coralie Common, Shelly Coss, MD.   No recent labs.    RUQ USG 06/2017 - no gall bladder.   Abdominal pain: Onset: many months, not changed,occurs when she lays in bed, on and off. Usually twice a day , lasts around 1 hour  Site :Rt side of her abdomen  Radiation: localized  Severity :usually 7-8/10  Nature of pain: dull in nature  Aggravating factors: nothing  Relieving factors :nothing  Weight loss: gained weight  NSAID use: none  PPI use :none  Gall bladder surgery: taken out 30 years back  Frequency of bowel movements: goes daily - soft  Change in bowel movements: no  Relief with bowel movements: yes  Gas/Bloating/Abdominal distension: yes   Never had a colonoscopy- does not like it -aware of the risks of colon cancer.   "weight loss surgery gastric by pass"  -many years back - worked for a little while .   Past Medical History:  Diagnosis Date  . Anemia   . Anxiety   . Arthritis   . Asthma   . Bronchitis   . Cancer of cervix ([place])   . Cataracts, bilateral   . Cellulitis   . Cervical cancer ([place])   . Chest pain    with exertion  . Complication of anesthesia    hard to wake up  . COPD (chronic obstructive pulmonary disease) ([facility])   . Cramps, extremity   . Depression   . Diabetes mellitus without complication ([place])   . Enlarged heart   . Heart murmur   . History of fall   . History of shingles   . Hyperlipidemia   . Hypothyroidism   . Panic attacks   . Pneumonia    numerous times in Jan. 2013  . Schizoaffective disorder ([place])     . Sleep apnea   . Varicose veins     Past Surgical History:  Procedure Laterality Date  . BREAST LUMPECTOMY    . CARDIAC CATHETERIZATION Bilateral 02/08/2015   Procedure: Right and Left Heart Cath;  Surgeon: Yolonda Kida, MD;  Location: [facility];  Service: Cardiovascular;  Laterality: Bilateral;  . cervical surgery    . CESAREAN SECTION    . CHOLECYSTECTOMY    . GALLBLADDER SURGERY    . GASTRIC BYPASS    . INTRAOPERATIVE TRANSESOPHAGEAL ECHOCARDIOGRAM N/A 12/19/2012   Procedure: INTRAOPERATIVE TRANSESOPHAGEAL ECHOCARDIOGRAM;  Surgeon: Ivin Poot, MD;  Location: [facility];  Service: Thoracic;  Laterality: N/A;  . SUBXYPHOID PERICARDIAL WINDOW N/A 12/19/2012   Procedure: SUBXYPHOID PERICARDIAL WINDOW;  Surgeon: Ivin Poot, MD;  Location: [facility];  Service: Thoracic;  Laterality: N/A;  . TUBAL LIGATION      Prior to Admission medications   Medication Sig Start Date End Date Taking? Authorizing Provider  acetaminophen (TYLENOL) 650 MG CR tablet Take 650 mg by mouth every 8 (eight) hours as needed for pain.   Yes [provider]  albuterol (PROVENTIL HFA;VENTOLIN HFA) 108 (90 BASE) MCG/ACT  inhaler Inhale 2 puffs into the lungs every 6 (six) hours as needed for wheezing.   Yes [provider]  atorvastatin (LIPITOR) 40 MG tablet Take 40 mg by mouth daily.   Yes [provider]  budesonide (RHINOCORT AQUA) 32 MCG/ACT nasal spray Place into the nose.   Yes [provider]  calcitRIOL (ROCALTROL) 0.25 MCG capsule Take 0.25 mcg by mouth 3 (three) times a week. Monday, Wednesday, and friday   Yes [provider]  cetirizine (ZYRTEC) 10 MG tablet Take 10 mg by mouth daily.   Yes [provider]  cholecalciferol (VITAMIN D) 1000 UNITS tablet Take 400 Units by mouth daily.   Yes [provider]  Cholecalciferol (VITAMIN D3) 50000 UNITS CAPS Take 400 Units by mouth every morning.    Yes [provider]   clonazePAM (KLONOPIN) 1 MG tablet Take 1 mg by mouth at bedtime.    Yes [provider]  fluticasone (FLONASE) 50 MCG/ACT nasal spray Place 1 spray into the nose daily.  03/25/13  Yes [provider]  Fluticasone-Salmeterol (ADVAIR) 100-50 MCG/DOSE AEPB Inhale 1 puff into the lungs every 12 (twelve) hours.   Yes [provider]  Insulin Pen Needle (NOVOFINE) 32G X 6 MM MISC by Does not apply route.   Yes [provider]  ipratropium (ATROVENT HFA) 17 MCG/ACT inhaler Inhale 2 puffs into the lungs every 6 (six) hours.   Yes [provider]  levothyroxine (SYNTHROID, LEVOTHROID) 137 MCG tablet Take 137 mcg by mouth daily before breakfast.   Yes [provider]  Liraglutide (VICTOZA) 18 MG/3ML SOPN Inject 1 application into the skin once a week.    Yes [provider]  lubiprostone (AMITIZA) 24 MCG capsule Take 24 mcg by mouth 2 (two) times daily with a meal.   Yes [provider]  metFORMIN (GLUCOPHAGE) 500 MG tablet Take by mouth. 05/18/15  Yes [provider]  metoprolol tartrate (LOPRESSOR) 12.5 mg TABS Take 12.5 mg by mouth 2 (two) times daily.   Yes [provider]  montelukast (SINGULAIR) 10 MG tablet Take 10 mg by mouth at bedtime.  03/31/13  Yes [provider]  Multiple Vitamin (MULTIVITAMIN) tablet Take 1 tablet by mouth daily.   Yes [provider]  nystatin cream (MYCOSTATIN) Apply 1 application topically 2 (two) times daily.   Yes [provider]  QUEtiapine (SEROQUEL) 400 MG tablet Take 200 mg by mouth at bedtime.    Yes [provider]  sennosides-docusate sodium (SENOKOT-S) 8.6-50 MG tablet Take 1 tablet by mouth daily.   Yes [provider]  sertraline (ZOLOFT) 50 MG tablet Take 50 mg by mouth daily.   Yes [provider]  simvastatin (ZOCOR) 10 MG tablet Take 10 mg by mouth at bedtime.   Yes [provider]  sodium chloride (ALTAMIST  SPRAY) 0.65 % nasal spray 1 spray by Each Nare route every six (6) hours as needed for congestion.   Yes [provider]  tiotropium (SPIRIVA) 18 MCG inhalation capsule Place 18 mcg into inhaler and inhale daily.   Yes [provider]  topiramate (TOPAMAX) 100 MG tablet Take 100 mg by mouth 2 (two) times daily.   Yes [provider]  traMADol (ULTRAM) 50 MG tablet tramadol 50 mg tablet  Take 1 tablet every 4 hours by oral route. 05/20/15  Yes [provider]  traZODone (DESYREL) 100 MG tablet Take 150 mg by mouth at bedtime.    Yes [provider]  vitamin B-12 (CYANOCOBALAMIN) 1000 MCG tablet Take 1,000 mcg by mouth daily.   Yes [provider]  Vitamin D, Ergocalciferol, (DRISDOL) 50000 UNITS CAPS capsule Take 50,000 Units by mouth every 7 (seven) days.   Yes [provider]  ziprasidone (GEODON) 80 MG capsule Take 80 mg by mouth 2 (two) times daily with a meal.   Yes [provider]    Family History  Problem Relation Age of Onset  . Heart Problems Mother   . Heart Problems Father   . Heart Problems Brother   . Colon cancer Neg Hx      Social History   Tobacco Use  . Smoking status: Former Smoker    Packs/day: 1.00    Years: 20.00    Pack years: 20.00    Types: Cigarettes    Last attempt to quit: 02/08/1995    Years since quitting: 22.6  . Smokeless tobacco: Never Used  Substance Use Topics  . Alcohol use: No  . Drug use: No    Allergies as of 10/10/2017  . (No Known Allergies)    Review of Systems:    All systems reviewed and negative except where noted in HPI.   Physical Exam:  BP 115/72   Pulse 83   Wt 228 lb (103.4 kg)   BMI 41.04 kg/m  No LMP recorded. Patient is postmenopausal. Psych:  Alert and cooperative. Normal mood and affect. General:   Alert,  Well-developed, well-nourished, pleasant and cooperative in NAD Head:  Normocephalic and atraumatic. Eyes:  Sclera clear, no icterus.    Conjunctiva pink. Ears:  Normal auditory acuity. Nose:  No deformity, discharge, or lesions. Mouth:  No deformity or lesions,oropharynx pink & moist. Neck:  Supple; no masses or thyromegaly. Lungs:  Respirations even and unlabored.  Clear throughout to auscultation.   No wheezes, crackles, or rhonchi. No acute distress. Heart:  Regular rate and rhythm; no murmurs, clicks, rubs, or gallops. Abdomen:  Normal bowel sounds.  No bruits.  Soft, ruq tendernes  and non-distended without masses, hepatosplenomegaly or hernias noted.  No guarding or rebound tenderness.    Neurologic:  Alert and oriented x3;  grossly normal neurologically. Skin:  Intact without significant lesions or rashes. No jaundice. Lymph Nodes:  No significant cervical adenopathy. Psych:  Alert and cooperative. Normal mood and affect.  Imaging Studies: No results found.  Assessment and Plan:   Heather Compton is a 72 y.o. y/o female has been referred for Right sided abdominal pain which has been ongoing for many months, no red flag signs. H/o constipation- possible IBS-C, H/o gastric bypass possible ulcers.   Plan  1. PPI  2. Linzess 145 mcg daily - 2 weeks samples provided 3. CT abdomen and pelvis 4. If no better at next visit will recommend EGD+colonosocpy + H pylori testing ,CBC,CMP. Presently she is absolutely against a colonoscopy for colon cancer screening,   Follow up in 4 weeks   Dr Jonathon Bellows MD,MRCP(U.K)

## 2017-11-21 ENCOUNTER — Ambulatory Visit: Payer: Medicare (Managed Care) | Admitting: Gastroenterology

## 2018-10-15 ENCOUNTER — Ambulatory Visit: Payer: Medicare (Managed Care) | Attending: Neurology

## 2018-10-15 DIAGNOSIS — F5105 Insomnia due to other mental disorder: Secondary | ICD-10-CM | POA: Diagnosis present

## 2018-10-15 DIAGNOSIS — G4761 Periodic limb movement disorder: Secondary | ICD-10-CM | POA: Insufficient documentation

## 2018-10-16 ENCOUNTER — Other Ambulatory Visit: Payer: Self-pay

## 2023-08-19 ENCOUNTER — Encounter: Payer: Self-pay | Admitting: Internal Medicine

## 2023-08-19 ENCOUNTER — Ambulatory Visit (INDEPENDENT_AMBULATORY_CARE_PROVIDER_SITE_OTHER): Payer: Medicare (Managed Care) | Admitting: Internal Medicine

## 2023-08-19 VITALS — BP 100/60 | HR 76 | Temp 98.6°F | Ht 62.0 in | Wt 205.6 lb

## 2023-08-19 DIAGNOSIS — E669 Obesity, unspecified: Secondary | ICD-10-CM | POA: Diagnosis not present

## 2023-08-19 DIAGNOSIS — G4710 Hypersomnia, unspecified: Secondary | ICD-10-CM

## 2023-08-19 DIAGNOSIS — R5381 Other malaise: Secondary | ICD-10-CM | POA: Diagnosis not present

## 2023-08-19 DIAGNOSIS — G4733 Obstructive sleep apnea (adult) (pediatric): Secondary | ICD-10-CM

## 2023-08-19 DIAGNOSIS — Z87891 Personal history of nicotine dependence: Secondary | ICD-10-CM

## 2023-08-19 DIAGNOSIS — Z6837 Body mass index (BMI) 37.0-37.9, adult: Secondary | ICD-10-CM

## 2023-08-19 DIAGNOSIS — F5101 Primary insomnia: Secondary | ICD-10-CM

## 2023-08-19 MED ORDER — ZOLPIDEM TARTRATE ER 6.25 MG PO TBCR: 6.2500 mg | EXTENDED_RELEASE_TABLET | Freq: Every day | ORAL | 0 refills | Status: AC

## 2023-08-19 NOTE — Patient Instructions (Signed)
 PLEASE EXERCISE ONE HOUR PER DAY RECOMMEND AMBIEN 6.25 mg at night around 9PM Recommend Home sleep test-please use when sleeping Recommend weight loss  Avoid Allergens and Irritants Avoid secondhand smoke Avoid SICK contacts Recommend  Masking  when appropriate Recommend Keep up-to-date with vaccinations

## 2023-08-19 NOTE — Progress Notes (Signed)
 Name: Heather Compton MRN: [id number] DOB: 1945-10-28    CHIEF COMPLAINT:  EXCESSIVE DAYTIME SLEEPINESS, insomnia Spanish-speaking only Spanish interpreter used for communication   HISTORY OF PRESENT ILLNESS: Patient is seen today for problems and issues with sleep related to excessive daytime sleepiness Patient  has been having sleep problems for many years Patient has been having excessive daytime sleepiness for a long time Patient has been having extreme fatigue and tiredness, lack of energy  Discussed sleep data and reviewed with patient.  Encouraged proper weight management.  Discussed driving precautions and its relationship with hypersomnolence.  Discussed operating dangerous equipment and its relationship with hypersomnolence.  Discussed sleep hygiene, and benefits of a fixed sleep waked time.  The importance of getting eight or more hours of sleep discussed with patient.  Discussed limiting the use of the computer and television before bedtime.  Decrease naps during the day, so night time sleep will become enhanced.  Limit caffeine, and sleep deprivation.  HTN, stroke, and heart failure are potential risk factors.    EPWORTH SLEEP SCORE 0  Patient underwent sleep study July 2020 No significant findings for obstructive sleep apnea AHI was measured at 0.3   No exacerbation at this time No evidence of heart failure at this time No evidence or signs of infection at this time No respiratory distress No fevers, chills, nausea, vomiting, diarrhea No evidence of lower extremity edema No evidence hemoptysis  Patient with severe chronic insomnia going on for 5 years She goes to bed around 10 PM She falls asleep around 6 AM and wakes up at 9 AM Patient is very nonactive does not exercise Patient lives in a mobile home Patient does not leave her home Patient reads the Bible and prays on a daily basis Nonalcoholic non-smoker     PAST MEDICAL HISTORY :   has a  past medical history of Anemia, Anxiety, Arthritis, Asthma, Bronchitis, Cancer of cervix (HCC), Cataracts, bilateral, Cellulitis, Cervical cancer (HCC), Chest pain, Complication of anesthesia, COPD (chronic obstructive pulmonary disease) (HCC), Cramps, extremity, Depression, Diabetes mellitus without complication (HCC), Enlarged heart, Heart murmur, History of fall, History of shingles, Hyperlipidemia, Hypothyroidism, Panic attacks, Pneumonia, Schizoaffective disorder (HCC), Sleep apnea, and Varicose veins.  has a past surgical history that includes Gallbladder surgery; Gastric bypass; cervical surgery; Breast lumpectomy; Cesarean section; Cholecystectomy; Tubal ligation; Subxyphoid pericardial window (N/A, 12/19/2012); Intraoprative transesophageal echocardiogram (N/A, 12/19/2012); and Cardiac catheterization (Bilateral, 02/08/2015). Prior to Admission medications   Medication Sig Start Date End Date Taking? Authorizing Provider  acetaminophen  (TYLENOL ) 650 MG CR tablet Take 650 mg by mouth every 8 (eight) hours as needed for pain.    [provider]  albuterol  (PROVENTIL  HFA;VENTOLIN  HFA) 108 (90 BASE) MCG/ACT inhaler Inhale 2 puffs into the lungs every 6 (six) hours as needed for wheezing.    [provider]  atorvastatin (LIPITOR) 40 MG tablet Take 40 mg by mouth daily.    [provider]  budesonide (RHINOCORT AQUA) 32 MCG/ACT nasal spray Place into the nose.    [provider]  calcitRIOL  (ROCALTROL ) 0.25 MCG capsule Take 0.25 mcg by mouth 3 (three) times a week. Monday, Wednesday, and friday    [provider]  cetirizine (ZYRTEC) 10 MG tablet Take 10 mg by mouth daily.    [provider]  cholecalciferol (VITAMIN D ) 1000 UNITS tablet Take 400 Units by mouth daily.    [provider]  Cholecalciferol (VITAMIN D3) 50000 UNITS CAPS Take 400 Units by mouth every morning.  [provider]  clonazePAM  (KLONOPIN ) 1 MG tablet Take 1  mg by mouth at bedtime.     [provider]  fluticasone (FLONASE) 50 MCG/ACT nasal spray Place 1 spray into the nose daily.  03/25/13   [provider]  Fluticasone-Salmeterol (ADVAIR) 100-50 MCG/DOSE AEPB Inhale 1 puff into the lungs every 12 (twelve) hours.    [provider]  Insulin  Pen Needle (NOVOFINE) 32G X 6 MM MISC by Does not apply route.    [provider]  ipratropium (ATROVENT HFA) 17 MCG/ACT inhaler Inhale 2 puffs into the lungs every 6 (six) hours.    [provider]  levothyroxine  (SYNTHROID , LEVOTHROID) 137 MCG tablet Take 137 mcg by mouth daily before breakfast.    [provider]  Liraglutide  (VICTOZA ) 18 MG/3ML SOPN Inject 1 application into the skin once a week.     [provider]  lubiprostone  (AMITIZA ) 24 MCG capsule Take 24 mcg by mouth 2 (two) times daily with a meal.    [provider]  metFORMIN (GLUCOPHAGE) 500 MG tablet Take by mouth. 05/18/15   [provider]  metoprolol  tartrate (LOPRESSOR ) 12.5 mg TABS Take 12.5 mg by mouth 2 (two) times daily.    [provider]  montelukast (SINGULAIR) 10 MG tablet Take 10 mg by mouth at bedtime.  03/31/13   [provider]  Multiple Vitamin (MULTIVITAMIN) tablet Take 1 tablet by mouth daily.    [provider]  nystatin cream (MYCOSTATIN) Apply 1 application topically 2 (two) times daily.    [provider]  omeprazole  (PRILOSEC) 40 MG capsule Take 1 capsule (40 mg total) by mouth daily. 10/10/17   Luke Salaam, MD  QUEtiapine  (SEROQUEL ) 400 MG tablet Take 200 mg by mouth at bedtime.     [provider]  sennosides-docusate sodium  (SENOKOT-S) 8.6-50 MG tablet Take 1 tablet by mouth daily.    [provider]  sertraline (ZOLOFT) 50 MG tablet Take 50 mg by mouth daily.    [provider]  simvastatin  (ZOCOR ) 10 MG tablet Take 10 mg by mouth at bedtime.    [provider]  sodium  chloride (ALTAMIST SPRAY) 0.65 % nasal spray 1 spray by Each Nare route every six (6) hours as needed for congestion.    [provider]  tiotropium (SPIRIVA ) 18 MCG inhalation capsule Place 18 mcg into inhaler and inhale daily.    [provider]  topiramate  (TOPAMAX ) 100 MG tablet Take 100 mg by mouth 2 (two) times daily.    [provider]  traMADol (ULTRAM) 50 MG tablet tramadol 50 mg tablet  Take 1 tablet every 4 hours by oral route. 05/20/15   [provider]  traZODone  (DESYREL ) 100 MG tablet Take 150 mg by mouth at bedtime.     [provider]  vitamin B-12 (CYANOCOBALAMIN) 1000 MCG tablet Take 1,000 mcg by mouth daily.    [provider]  Vitamin D , Ergocalciferol , (DRISDOL ) 50000 UNITS CAPS capsule Take 50,000 Units by mouth every 7 (seven) days.    [provider]  ziprasidone  (GEODON ) 80 MG capsule Take 80 mg by mouth 2 (two) times daily with a meal.    [provider]   No Known Allergies  FAMILY HISTORY:  family history includes Heart Problems in her brother, father, and mother. SOCIAL HISTORY:  reports that she quit smoking about 28 years ago. Her smoking use included cigarettes. She started smoking about 48 years ago. She has a 20  pack-year smoking history. She has never used smokeless tobacco. She reports that she does not drink alcohol and does not use drugs.   Review of Systems:  Gen:  Denies  fever, sweats, chills weight loss  HEENT: Denies blurred vision, double vision, ear pain, eye pain, hearing loss, nose bleeds, sore throat Cardiac:  No dizziness, chest pain or heaviness, chest tightness,edema, No JVD Resp:   No cough, -sputum production, -shortness of breath,-wheezing, -hemoptysis,  Gi: Denies swallowing difficulty, stomach pain, nausea or vomiting, diarrhea, constipation, bowel incontinence Gu:  Denies bladder incontinence, burning urine Ext:   Denies Joint pain, stiffness or swelling Skin:  Denies  skin rash, easy bruising or bleeding or hives Endoc:  Denies polyuria, polydipsia , polyphagia or weight change Psych:   Denies depression, insomnia or hallucinations  Other:  All other systems negative   ALL OTHER ROS ARE NEGATIVE  BP 100/60 (BP Location: Right Arm, Patient Position: Sitting, Cuff Size: Large)   Pulse 76   Temp 98.6 F (37 C) (Oral)   Ht 5\' 2"  (1.575 m)   Wt 205 lb 9.6 oz (93.3 kg)   SpO2 96%   BMI 37.60 kg/m     Physical Examination:   General Appearance: No distress  EYES PERRLA, EOM intact.   NECK Supple, No JVD ORAL CAVITY MALLAMPATI 4 Pulmonary: normal breath sounds, No wheezing.  CardiovascularNormal S1,S2.  No m/r/g.   Abdomen: Benign, Soft, non-tender. Skin:   warm, no rashes, no ecchymosis  Extremities: normal, no cyanosis, clubbing. Neuro:without focal findings,  speech normal  PSYCHIATRIC: Mood, affect within normal limits.   ALL OTHER ROS ARE NEGATIVE    ASSESSMENT AND PLAN SYNOPSIS  Patient with signs and symptoms of excessive daytime sleepiness with probable underlying diagnosis of obstructive sleep apnea in the setting of obesity and deconditioned state, severe insomnia   Recommend Sleep Study for definitve diagnosis Although previous sleep study was nondiagnostic will repeat home sleep study to assess for sleep apnea  Severe insomnia I recommend 1 hour of daily activity with walking Patient on significant amounts of Seroquel  400 mg Patient currently on clonazepam  Recommend low-dose Ambien Patient continue with melatonin  Discussed sleep hygiene, and benefits of a fixed sleep waked time.  The importance of getting eight or more hours of sleep discussed with patient.  Discussed limiting the use of the computer and television before bedtime.    Obesity -recommend significant weight loss -recommend changing diet  Deconditioned state -Recommend increased daily activity and exercise   MEDICATION ADJUSTMENTS/LABS  AND TESTS ORDERED: PLEASE EXERCISE ONE HOUR PER DAY RECOMMEND AMBIEN 6.25 mg at night around 9PM Recommend Home sleep test-please use when sleeping Recommend weight loss Avoid Allergens and Irritants Avoid secondhand smoke Avoid SICK contacts Recommend  Masking  when appropriate Recommend Keep up-to-date with vaccinations    CURRENT MEDICATIONS REVIEWED AT LENGTH WITH PATIENT TODAY   Patient  satisfied with Plan of action and management. All questions answered  Follow up  4 weeks   I spent a total of  65 minutes reviewing chart data, face-to-face evaluation with the patient, counseling and coordination of care as detailed above.    Lady Pier, M.D.  [facility]  Medical Director [facility] Medical Director [facility] Cardio-Pulmonary Department

## 2023-10-04 ENCOUNTER — Ambulatory Visit

## 2023-10-04 DIAGNOSIS — G4733 Obstructive sleep apnea (adult) (pediatric): Secondary | ICD-10-CM

## 2023-10-09 ENCOUNTER — Ambulatory Visit: Payer: Medicare (Managed Care) | Admitting: Internal Medicine

## 2023-10-17 DIAGNOSIS — G4733 Obstructive sleep apnea (adult) (pediatric): Secondary | ICD-10-CM | POA: Diagnosis not present

## 2023-10-31 ENCOUNTER — Ambulatory Visit: Payer: Medicare (Managed Care) | Admitting: Internal Medicine

## 2023-10-31 ENCOUNTER — Encounter: Payer: Self-pay | Admitting: Internal Medicine

## 2023-10-31 VITALS — BP 102/60 | HR 81 | Temp 98.2°F | Ht 62.5 in | Wt 207.2 lb

## 2023-10-31 DIAGNOSIS — G4733 Obstructive sleep apnea (adult) (pediatric): Secondary | ICD-10-CM | POA: Diagnosis not present

## 2023-10-31 NOTE — Patient Instructions (Signed)
 Recommend starting AUTO CPAP 4-12 cm h20 Start Nasal Pillow Air fit P30i Mask  Avoid Allergens and Irritants Avoid secondhand smoke Avoid SICK contacts Recommend  Masking  when appropriate Recommend Keep up-to-date with vaccinations  Recommend weight loss

## 2023-10-31 NOTE — Progress Notes (Signed)
 Name: Heather Compton MRN: [id number] DOB: May 28, 1945    CHIEF COMPLAINT:  Follow up OSA HST shows AHI 6.6 insomnia Spanish-speaking only Spanish interpreter used for communication   HISTORY OF PRESENT ILLNESS: Discussed sleep data and reviewed with patient.  Encouraged proper weight management.  Patient underwent sleep study July 2020 No significant findings for obstructive sleep apnea AHI was measured at 0.3 Repeat HST shwos AHI 6.6   I have discussed all options with patient at this time  Option #1 Try autoCPAP therapy 4-14 cm h20, with face mask of choice  Option #2  Assess for hypoglossal nerve stimulator after trying CPAP for 30 days  Option #3 Referral to dental office for oral appliance and repeat sleep study test  Option #4  Aggressive weight loss and repeat HST  Option #5 is to do nothing  Patient would like to start auto CPAP and nasal pillow therapy  No exacerbation at this time No evidence of heart failure at this time No evidence or signs of infection at this time No respiratory distress No fevers, chills, nausea, vomiting, diarrhea No evidence of lower extremity edema No evidence hemoptysis  Patient with severe chronic insomnia going on for 5 years She goes to bed around 10 PM She falls asleep around 6 AM and wakes up at 9 AM Patient is very nonactive does not exercise Patient lives in a mobile home Patient does not leave her home Patient reads the Bible and prays on a daily basis Nonalcoholic non-smoker Ambien  6.25 did not show any significant improvement insomnia Will avoid all medications at this time     PAST MEDICAL HISTORY :   has a past medical history of Anemia, Anxiety, Arthritis, Asthma, Bronchitis, Cancer of cervix (HCC), Cataracts, bilateral, Cellulitis, Cervical cancer (HCC), Chest pain, Complication of anesthesia, COPD (chronic obstructive pulmonary disease) (HCC), Cramps, extremity, Depression, Diabetes mellitus without  complication (HCC), Enlarged heart, Heart murmur, History of fall, History of shingles, Hyperlipidemia, Hypothyroidism, Panic attacks, Pneumonia, Schizoaffective disorder (HCC), Sleep apnea, and Varicose veins.  has a past surgical history that includes Gallbladder surgery; Gastric bypass; cervical surgery; Breast lumpectomy; Cesarean section; Cholecystectomy; Tubal ligation; Subxyphoid pericardial window (N/A, 12/19/2012); Intraoprative transesophageal echocardiogram (N/A, 12/19/2012); and Cardiac catheterization (Bilateral, 02/08/2015). Prior to Admission medications   Medication Sig Start Date End Date Taking? Authorizing Provider  acetaminophen  (TYLENOL ) 650 MG CR tablet Take 650 mg by mouth every 8 (eight) hours as needed for pain.    [provider]  albuterol  (PROVENTIL  HFA;VENTOLIN  HFA) 108 (90 BASE) MCG/ACT inhaler Inhale 2 puffs into the lungs every 6 (six) hours as needed for wheezing.    [provider]  atorvastatin (LIPITOR) 40 MG tablet Take 40 mg by mouth daily.    [provider]  budesonide (RHINOCORT AQUA) 32 MCG/ACT nasal spray Place into the nose.    [provider]  calcitRIOL  (ROCALTROL ) 0.25 MCG capsule Take 0.25 mcg by mouth 3 (three) times a week. Monday, Wednesday, and friday    [provider]  cetirizine (ZYRTEC) 10 MG tablet Take 10 mg by mouth daily.    [provider]  cholecalciferol (VITAMIN D ) 1000 UNITS tablet Take 400 Units by mouth daily.    [provider]  Cholecalciferol (VITAMIN D3) 50000 UNITS CAPS Take 400 Units by mouth every morning.     [provider]  clonazePAM  (KLONOPIN ) 1 MG tablet Take 1 mg by mouth at bedtime.     [provider]  fluticasone (FLONASE) 50 MCG/ACT  nasal spray Place 1 spray into the nose daily.  03/25/13   [provider]  Fluticasone-Salmeterol (ADVAIR) 100-50 MCG/DOSE AEPB Inhale 1 puff into the lungs every 12 (twelve) hours.    [provider]  Insulin  Pen Needle (NOVOFINE) 32G X 6 MM MISC by Does not apply route.    [provider]  ipratropium (ATROVENT HFA) 17 MCG/ACT inhaler Inhale 2 puffs into the lungs every 6 (six) hours.    [provider]  levothyroxine  (SYNTHROID , LEVOTHROID) 137 MCG tablet Take 137 mcg by mouth daily before breakfast.    [provider]  Liraglutide  (VICTOZA ) 18 MG/3ML SOPN Inject 1 application into the skin once a week.     [provider]  lubiprostone  (AMITIZA ) 24 MCG capsule Take 24 mcg by mouth 2 (two) times daily with a meal.    [provider]  metFORMIN (GLUCOPHAGE) 500 MG tablet Take by mouth. 05/18/15   [provider]  metoprolol  tartrate (LOPRESSOR ) 12.5 mg TABS Take 12.5 mg by mouth 2 (two) times daily.    [provider]  montelukast (SINGULAIR) 10 MG tablet Take 10 mg by mouth at bedtime.  03/31/13   [provider]  Multiple Vitamin (MULTIVITAMIN) tablet Take 1 tablet by mouth daily.    [provider]  nystatin cream (MYCOSTATIN) Apply 1 application topically 2 (two) times daily.    [provider]  omeprazole  (PRILOSEC) 40 MG capsule Take 1 capsule (40 mg total) by mouth daily. 10/10/17   Therisa Bi, MD  QUEtiapine  (SEROQUEL ) 400 MG tablet Take 200 mg by mouth at bedtime.     [provider]  sennosides-docusate sodium  (SENOKOT-S) 8.6-50 MG tablet Take 1 tablet by mouth daily.    [provider]  sertraline (ZOLOFT) 50 MG tablet Take 50 mg by mouth daily.    [provider]  simvastatin  (ZOCOR ) 10 MG tablet Take 10 mg by mouth at bedtime.    [provider]  sodium chloride  (ALTAMIST SPRAY) 0.65 % nasal spray 1 spray by Each Nare route every six (6) hours as needed for congestion.    [provider]  tiotropium (SPIRIVA ) 18 MCG inhalation capsule Place 18 mcg into inhaler and inhale daily.    [provider]  topiramate  (TOPAMAX ) 100 MG  tablet Take 100 mg by mouth 2 (two) times daily.    [provider]  traMADol (ULTRAM) 50 MG tablet tramadol 50 mg tablet  Take 1 tablet every 4 hours by oral route. 05/20/15   [provider]  traZODone  (DESYREL ) 100 MG tablet Take 150 mg by mouth at bedtime.     [provider]  vitamin B-12 (CYANOCOBALAMIN) 1000 MCG tablet Take 1,000 mcg by mouth daily.    [provider]  Vitamin D , Ergocalciferol , (DRISDOL ) 50000 UNITS CAPS capsule Take 50,000 Units by mouth every 7 (seven) days.    [provider]  ziprasidone  (GEODON ) 80 MG capsule Take 80 mg by mouth 2 (two) times daily with a meal.    [provider]   No Known Allergies  FAMILY HISTORY:  family history includes Heart Problems in her brother, father, and mother. SOCIAL HISTORY:  reports that she quit smoking about 28 years ago. Her smoking use included cigarettes. She started smoking about 48 years ago. She has a 20 pack-year smoking history. She has never used smokeless tobacco. She reports that she does not drink alcohol and does not use drugs.   Review of Systems:  Gen:  Denies  fever, sweats, chills weight loss  HEENT: Denies blurred vision, double vision, ear pain, eye pain, hearing loss, nose bleeds, sore throat Cardiac:  No dizziness, chest pain or heaviness, chest tightness,edema, No JVD Resp:   No cough, -sputum production, -shortness of breath,-wheezing, -hemoptysis,  Gi: Denies swallowing difficulty, stomach pain, nausea or vomiting, diarrhea, constipation, bowel incontinence Gu:  Denies bladder incontinence, burning urine Ext:   Denies Joint pain, stiffness or swelling Skin: Denies  skin rash, easy bruising or bleeding or hives Endoc:  Denies polyuria, polydipsia , polyphagia or weight change Psych:   Denies depression, insomnia or hallucinations  Other:  All other systems negative   ALL OTHER ROS ARE NEGATIVE  BP 102/60 (BP Location: Left Arm, Patient Position:  Sitting, Cuff Size: Large)   Pulse 81   Temp 98.2 F (36.8 C) (Oral)   Ht 5' 2.5 (1.588 m)   Wt 207 lb 3.2 oz (94 kg)   SpO2 95%   BMI 37.29 kg/m      Review of Systems: Gen:  Denies  fever, sweats, chills weight loss  HEENT: Denies blurred vision, double vision, ear pain, eye pain, hearing loss, nose bleeds, sore throat Cardiac:  No dizziness, chest pain or heaviness, chest tightness,edema, No JVD Resp:   No cough, -sputum production, -shortness of breath,-wheezing, -hemoptysis,  Other:  All other systems negative   Physical Examination:   General Appearance: No distress  EYES PERRLA, EOM intact.   NECK Supple, No JVD Pulmonary: normal breath sounds, No wheezing.  CardiovascularNormal S1,S2.  No m/r/g.   Abdomen: Benign, Soft, non-tender. Neurology UE/LE 5/5 strength, no focal deficits Ext pulses intact, cap refill intact ALL OTHER ROS ARE NEGATIVE   ASSESSMENT AND PLAN SYNOPSIS  78 year old pleasant Hispanic female seen today for underlying diagnosis of mild sleep apnea AHI of 6.6 in the setting of morbid obesity deconditioned state and severe insomnia  Assessment of OSA Recommend starting AUTO CPAP 4-12 cm h20 Start Nasal Pillow Air fit P30i Mask   Severe insomnia I recommend 1 hour of daily activity with walking Patient on significant amounts of Seroquel  400 mg Patient currently on clonazepam  We will stop Ambien  at this time Patient continue with melatonin  Obesity -recommend significant weight loss -recommend changing diet  Deconditioned state -Recommend increased daily activity and exercise    MEDICATION ADJUSTMENTS/LABS AND TESTS ORDERED: PLEASE EXERCISE ONE HOUR PER DAY Recommend starting AUTO CPAP 4-12 cm h20 Start Nasal Pillow Air fit P30i Mask Avoid Allergens and Irritants Avoid secondhand smoke Avoid SICK contacts Recommend  Masking  when appropriate Recommend Keep up-to-date with vaccinations Recommend weight loss     CURRENT  MEDICATIONS REVIEWED AT LENGTH WITH PATIENT TODAY   Patient  satisfied with Plan of action and management. All questions answered   Follow up  3 months   I spent a total of 42 minutes dedicated to the care of this patient on the date of this encounter to include pre-visit review of records, face-to-face time with the patient discussing conditions above, post visit ordering of testing, clinical documentation with the electronic health record, making appropriate referrals as documented, and communicating necessary information to the patient's healthcare team.    The Patient requires high complexity decision making for assessment and support, frequent evaluation and titration of therapies, application of advanced monitoring technologies and extensive interpretation of multiple databases.  Patient satisfied with Plan of action and management. All questions answered    Nickolas Alm Cellar, M.D.  [facility]  Pulmonary & Critical Care Medicine  Medical Director [facility] Medical Director [facility] Cardio-Pulmonary Department

## 2024-02-05 ENCOUNTER — Ambulatory Visit: Admitting: Internal Medicine

## 2024-02-05 ENCOUNTER — Encounter: Payer: Self-pay | Admitting: Nurse Practitioner

## 2024-02-05 ENCOUNTER — Ambulatory Visit: Admitting: Nurse Practitioner

## 2024-02-05 VITALS — BP 124/74 | HR 67 | Temp 97.5°F | Ht 62.5 in | Wt 205.8 lb

## 2024-02-05 DIAGNOSIS — Z6837 Body mass index (BMI) 37.0-37.9, adult: Secondary | ICD-10-CM

## 2024-02-05 DIAGNOSIS — E6609 Other obesity due to excess calories: Secondary | ICD-10-CM

## 2024-02-05 DIAGNOSIS — E66812 Obesity, class 2: Secondary | ICD-10-CM | POA: Diagnosis not present

## 2024-02-05 DIAGNOSIS — G4733 Obstructive sleep apnea (adult) (pediatric): Secondary | ICD-10-CM

## 2024-02-05 NOTE — Assessment & Plan Note (Signed)
 Mild OSA on CPAP. Excellent compliance and control. Receives benefit from use. Reviewed proper care/use of device. Minimal risks of mild OSA. Healthy weight loss encouraged. Safe driving practices reviewed.  Patient Instructions  Continue to use CPAP every night, minimum of 4-6 hours a night.  Change equipment as directed. Wash your tubing with warm soap and water daily, hang to dry. Wash humidifier portion weekly. Use bottled, distilled water and change daily Be aware of reduced alertness and do not drive or operate heavy machinery if experiencing this or drowsiness.  Exercise encouraged, as tolerated. Healthy weight management discussed.  Avoid or decrease alcohol consumption and medications that make you more sleepy, if possible. Notify if persistent daytime sleepiness occurs even with consistent use of PAP therapy.  Change CPAP supplies... Every month Mask cushions and/or nasal pillows CPAP machine filters Every 3 months Mask frame (not including the headgear) CPAP tubing Every 6 months Mask headgear Chin strap (if applicable) Humidifier water tub  Follow up in one year with Heather Compton or Heather Compton PIETY. If symptoms do not improve or worsen, please contact office for sooner follow up    Contine usando CPAP todas las noches, un mnimo de 4 a 6 horas por noche.  Cambie el equipo segn las indicaciones. Lave el tubo con agua tibia y jabn diariamente y culguelo para que se seque. Lave la porcin del humidificador semanalmente. Utilice agua destilada embotellada y nepal diariamente. Tenga en cuenta la reduccin del estado de alerta y no conduzca ni opere maquinaria pesada si experimenta esto o somnolencia.  Se recomienda el ejercicio, segn se tolere. Se discute el control de peso saludable.  Evite o disminuya el consumo de alcohol y [employer] produzcan ms sueo, si es posible. Notifique si se produce somnolencia diurna persistente incluso con el uso constante de la  terapia PAP.  Cambiar suministros de CPAP... Cada mes [place] para mascarilla y/o almohadillas nasales [place]iltros para mquinas CPAP Cada 3 meses [place] (sin incluir el arns) tubo de CPAP Cada 6 meses [place] de tocado [place] para la barbilla (si corresponde) [place] de agua humidificador  Haga un seguimiento en un ao con el Heather Compton malva Heather Malachy, NP. Si los sntomas no mejoran o empeoran, comunquese con el consultorio para un seguimiento ms rpido.

## 2024-02-05 NOTE — Patient Instructions (Addendum)
 Continue to use CPAP every night, minimum of 4-6 hours a night.  Change equipment as directed. Wash your tubing with warm soap and water daily, hang to dry. Wash humidifier portion weekly. Use bottled, distilled water and change daily Be aware of reduced alertness and do not drive or operate heavy machinery if experiencing this or drowsiness.  Exercise encouraged, as tolerated. Healthy weight management discussed.  Avoid or decrease alcohol consumption and medications that make you more sleepy, if possible. Notify if persistent daytime sleepiness occurs even with consistent use of PAP therapy.  Change CPAP supplies... Every month Mask cushions and/or nasal pillows CPAP machine filters Every 3 months Mask frame (not including the headgear) CPAP tubing Every 6 months Mask headgear Chin strap (if applicable) Humidifier water tub  Follow up in one year with Dr. Isaiah or Heather Compton. If symptoms do not improve or worsen, please contact office for sooner follow up    Contine usando CPAP todas las noches, un mnimo de 4 a 6 horas por noche.  Cambie el equipo segn las indicaciones. Lave el tubo con agua tibia y jabn diariamente y culguelo para que se seque. Lave la porcin del humidificador semanalmente. Utilice agua destilada embotellada y nepal diariamente. Tenga en cuenta la reduccin del estado de alerta y no conduzca ni opere maquinaria pesada si experimenta esto o somnolencia.  Se recomienda el ejercicio, segn se tolere. Se discute el control de peso saludable.  Evite o disminuya el consumo de alcohol y [employer] produzcan ms sueo, si es posible. Notifique si se produce somnolencia diurna persistente incluso con el uso constante de la terapia PAP.  Cambiar suministros de CPAP... Cada mes Almohadillas para mascarilla y/o almohadillas nasales Filtros para mquinas CPAP Cada 3 meses Marco de [place] (sin incluir el arns) tubo de CPAP Cada 6 meses Mscara de  tocado Correa para la barbilla (si corresponde) [place]ise de agua humidificador  Haga un seguimiento en un ao con el Dr. Isaiah malva Heather Malachy, NP. Si los sntomas no mejoran o empeoran, comunquese con el consultorio para un seguimiento ms rpido.

## 2024-02-05 NOTE — Assessment & Plan Note (Signed)
 BMI 37. Healthy weight loss measures encouraged

## 2024-02-05 NOTE — Progress Notes (Signed)
 @Patient  ID: Heather Compton, female    DOB: May 28, 1945, 78 y.o.   MRN: [id number]  Chief Complaint  Patient presents with   Sleep Apnea    CPAP is good. No problems with the mask. Pressure feels too low.     Referring provider: Sigurd Reena LABOR, MD  HPI: 78 year female, former smoker followed for OSA on CPAP. She is a patient of Dr. Jacqulyn and last seen in office 10/31/2023. Past medical history significant for HTN, hypothyroid, DM, depression, schizophrenia, PTSD, IDA, HLD.   TEST/EVENTS:  10/04/2023 HST: AHI 6.6/h, SpO2 low 73%  10/31/2023: OV with Dr. Isaiah. Prior PSG July 2020 without significant OSA. Repeat HST recently shows mild OSA 6.6/h. discussed treatment options. Pt opted for CPAP with nasal pillow.   02/05/2024: Today - follow up Discussed the use of AI scribe software for clinical note transcription with the patient, who gave verbal consent to proceed.  History of Present Illness Heather Compton is a 78 year old female with sleep apnea who presents for CPAP follow-up.  She has been using her CPAP machine regularly. Initially, she felt the pressures were low, but she is now comfortable with the current settings, which range from four to twelve, averaging around seven.  Her sleep pattern involves going to bed late and sleeping until about ten in the morning. Despite this schedule, she feels her sleep is restful and she wakes up with good energy. No snoring while using the CPAP, morning headaches, or issues with drowsy driving, as her husband drives.  She inquired about frequency of changing CPAP supplies.   01/05/2024-02/03/2024: CPAP 4-12 cmH2O 29/30 days; 97% >4 hr; average use 9 hr 20 min Pressure 95th 7.6 Leaks 95th 36.5 AHI 0.3    No Known Allergies  Immunization History  Administered Date(s) Administered   Influenza, Seasonal, Injecte, Preservative Fre 01/19/2011   Influenza-Unspecified 01/24/2012, 01/07/2013   Tdap 02/05/2013    Past Medical  History:  Diagnosis Date   Anemia    Anxiety    Arthritis    Asthma    Bronchitis    Cancer of cervix (HCC)    Cataracts, bilateral    Cellulitis    Cervical cancer (HCC)    Chest pain    with exertion   Complication of anesthesia    hard to wake up   COPD (chronic obstructive pulmonary disease) (HCC)    Cramps, extremity    Depression    Diabetes mellitus without complication (HCC)    Enlarged heart    Heart murmur    History of fall    History of shingles    Hyperlipidemia    Hypothyroidism    Panic attacks    Pneumonia    numerous times in Jan. 2013   Schizoaffective disorder (HCC)    Sleep apnea    Varicose veins     Tobacco History: Social History   Tobacco Use  Smoking Status Former   Current packs/day: 0.00   Average packs/day: 1 pack/day for 20.0 years (20.0 ttl pk-yrs)   Types: Cigarettes   Start date: 02/08/1975   Quit date: 02/08/1995   Years since quitting: 29.0  Smokeless Tobacco Never   Counseling given: Not Answered   Outpatient Medications Prior to Visit  Medication Sig Dispense Refill   acetaminophen  (TYLENOL ) 650 MG CR tablet Take 650 mg by mouth every 8 (eight) hours as needed for pain.     albuterol  (PROVENTIL  HFA;VENTOLIN  HFA) 108 (90 BASE) MCG/ACT inhaler Inhale 2 puffs  into the lungs every 6 (six) hours as needed for wheezing.     atorvastatin (LIPITOR) 40 MG tablet Take 40 mg by mouth daily.     budesonide (RHINOCORT AQUA) 32 MCG/ACT nasal spray Place into the nose.     calcitRIOL  (ROCALTROL ) 0.25 MCG capsule Take 0.25 mcg by mouth 3 (three) times a week. Monday, Wednesday, and friday     cetirizine (ZYRTEC) 10 MG tablet Take 10 mg by mouth daily.     Cholecalciferol (VITAMIN D3) 50 MCG (2000 UT) TABS Take 1 tablet by mouth daily.     clonazePAM  (KLONOPIN ) 1 MG tablet Take 1 mg by mouth at bedtime.      fluticasone (FLONASE) 50 MCG/ACT nasal spray Place 1 spray into the nose daily.      Fluticasone-Salmeterol (ADVAIR) 100-50 MCG/DOSE  AEPB Inhale 1 puff into the lungs every 12 (twelve) hours.     gabapentin (NEURONTIN) 100 MG capsule Take 100 mg by mouth 3 (three) times daily.     ipratropium (ATROVENT HFA) 17 MCG/ACT inhaler Inhale 2 puffs into the lungs every 6 (six) hours.     JARDIANCE 25 MG TABS tablet Take 25 mg by mouth daily.     lactulose (CHRONULAC) 10 GM/15ML solution SMARTSIG:Milliliter(s) By Mouth     LANTUS SOLOSTAR 100 UNIT/ML Solostar Pen Inject into the skin.     levothyroxine  (SYNTHROID , LEVOTHROID) 137 MCG tablet Take 137 mcg by mouth daily before breakfast.     Liraglutide  (VICTOZA ) 18 MG/3ML SOPN Inject 1 application into the skin once a week.      losartan (COZAAR) 25 MG tablet Take 25 mg by mouth daily.     lubiprostone  (AMITIZA ) 24 MCG capsule Take 24 mcg by mouth 2 (two) times daily with a meal.     metFORMIN (GLUCOPHAGE) 500 MG tablet Take by mouth.     metoprolol  succinate (TOPROL -XL) 50 MG 24 hr tablet Take 50 mg by mouth daily.     metoprolol  tartrate (LOPRESSOR ) 12.5 mg TABS Take 12.5 mg by mouth 2 (two) times daily.     miconazole (MICOTIN) 2 % cream Apply topically.     montelukast (SINGULAIR) 10 MG tablet Take 10 mg by mouth at bedtime.      Multiple Vitamin (MULTIVITAMIN) tablet Take 1 tablet by mouth daily.     Multiple Vitamins-Minerals (PRESERVISION AREDS 2) CAPS Take by mouth daily.     nystatin cream (MYCOSTATIN) Apply 1 application topically 2 (two) times daily.     OLANZapine (ZYPREXA) 15 MG tablet Take 15 mg by mouth at bedtime.     omeprazole  (PRILOSEC) 20 MG capsule Take 20 mg by mouth daily.     Prenatal 27-1 MG TABS Take 1 tablet by mouth daily.     QUEtiapine  (SEROQUEL ) 400 MG tablet Take 200 mg by mouth at bedtime.      REGULOID 43 % POWD Take by mouth.     senna (SENOKOT) 8.6 MG TABS tablet Take by mouth daily.     sennosides-docusate sodium  (SENOKOT-S) 8.6-50 MG tablet Take 1 tablet by mouth daily.     sertraline (ZOLOFT) 50 MG tablet Take 50 mg by mouth daily.      simvastatin  (ZOCOR ) 10 MG tablet Take 10 mg by mouth at bedtime.     sodium chloride  (ALTAMIST SPRAY) 0.65 % nasal spray 1 spray by Each Nare route every six (6) hours as needed for congestion.     tiotropium (SPIRIVA ) 18 MCG inhalation capsule Place 18 mcg into inhaler  and inhale daily.     topiramate  (TOPAMAX ) 100 MG tablet Take 100 mg by mouth 2 (two) times daily.     traMADol (ULTRAM) 50 MG tablet tramadol 50 mg tablet  Take 1 tablet every 4 hours by oral route.     traZODone  (DESYREL ) 100 MG tablet Take 150 mg by mouth at bedtime.      ULTICARE SHORT PEN NEEDLES 31G X 8 MM MISC Inject into the skin.     vitamin B-12 (CYANOCOBALAMIN) 1000 MCG tablet Take 1,000 mcg by mouth daily.     Vitamin D , Ergocalciferol , (DRISDOL ) 50000 UNITS CAPS capsule Take 50,000 Units by mouth every 7 (seven) days.     ziprasidone  (GEODON ) 80 MG capsule Take 80 mg by mouth 2 (two) times daily with a meal.     zolpidem  (AMBIEN  CR) 6.25 MG CR tablet Take 1 tablet (6.25 mg total) by mouth at bedtime. 30 tablet 0   No facility-administered medications prior to visit.     Review of Systems: as above    Physical Exam:  BP 124/74   Pulse 67   Temp (!) 97.5 F (36.4 C)   Ht 5' 2.5 (1.588 m)   Wt 205 lb 12.8 oz (93.4 kg)   SpO2 97%   BMI 37.04 kg/m   GEN: Pleasant, interactive, well-appearing; in no acute distress HEENT:  Normocephalic and atraumatic. PERRLA. Sclera white. Nasal turbinates pink, moist and patent bilaterally. No rhinorrhea present. Oropharynx pink and moist, without exudate or edema. No lesions, ulcerations, or postnasal drip. Mallampati IV NECK:  Supple w/ fair ROM. No lymphadenopathy.   CV: RRR, no m/r/g, no peripheral edema. Pulses intact, +2 bilaterally. No cyanosis, pallor or clubbing. PULMONARY:  Unlabored, regular breathing. Clear bilaterally A&P w/o wheezes/rales/rhonchi. No accessory muscle use.  GI: BS present and normoactive. Soft, non-tender to palpation.  MSK: No erythema,  warmth or tenderness. Cap refil <2 sec all extrem.  Neuro: A/Ox3. No focal deficits noted.   Skin: Warm, no lesions or rashe Psych: Normal affect and behavior. Judgement and thought content appropriate.     Lab Results:  CBC    Component Value Date/Time   WBC 5.4 12/23/2012 0448   RBC 3.41 (L) 12/23/2012 0448   HGB 11.2 (L) 04/26/2014 1615   HCT 32.6 (L) 12/23/2012 0448   HCT 33.6 (L) 10/10/2012 1453   PLT 213 12/23/2012 0448   PLT 217 10/10/2012 1453   MCV 95.6 12/23/2012 0448   MCV 100 10/10/2012 1453   MCH 31.7 12/23/2012 0448   MCHC 33.1 12/23/2012 0448   RDW 13.9 12/23/2012 0448   RDW 15.4 (H) 10/10/2012 1453   LYMPHSABS 2.1 10/06/2012 1505   MONOABS 0.5 10/06/2012 1505   EOSABS 0.1 10/06/2012 1505   BASOSABS 0.0 10/06/2012 1505    BMET    Component Value Date/Time   NA 138 12/23/2012 0448   NA 139 10/10/2012 1453   K 3.6 12/23/2012 0448   K 4.0 10/10/2012 1453   CL 106 12/23/2012 0448   CL 110 (H) 10/10/2012 1453   CO2 24 12/23/2012 0448   CO2 21 10/10/2012 1453   GLUCOSE 98 12/23/2012 0448   GLUCOSE 243 (H) 10/10/2012 1453   BUN 24 (H) 12/23/2012 0448   BUN 18 10/10/2012 1453   CREATININE 1.07 12/23/2012 0448   CREATININE 1.26 (H) 12/11/2012 1621   CALCIUM 8.4 12/23/2012 0448   CALCIUM 7.9 (L) 10/10/2012 1453   GFRNONAA 54 (L) 12/23/2012 0448   GFRNONAA 45 (L) 10/10/2012  1453   GFRAA 62 (L) 12/23/2012 0448   GFRAA 53 (L) 10/10/2012 1453    BNP    Component Value Date/Time   BNP 102 10/10/2012 1453     Imaging:  No results found.  Administration History     None           No data to display          No results found for: NITRICOXIDE      Assessment & Plan:   Sleep apnea Mild OSA on CPAP. Excellent compliance and control. Receives benefit from use. Reviewed proper care/use of device. Minimal risks of mild OSA. Healthy weight loss encouraged. Safe driving practices reviewed.  Patient Instructions  Continue to use CPAP  every night, minimum of 4-6 hours a night.  Change equipment as directed. Wash your tubing with warm soap and water daily, hang to dry. Wash humidifier portion weekly. Use bottled, distilled water and change daily Be aware of reduced alertness and do not drive or operate heavy machinery if experiencing this or drowsiness.  Exercise encouraged, as tolerated. Healthy weight management discussed.  Avoid or decrease alcohol consumption and medications that make you more sleepy, if possible. Notify if persistent daytime sleepiness occurs even with consistent use of PAP therapy.  Change CPAP supplies... Every month Mask cushions and/or nasal pillows CPAP machine filters Every 3 months Mask frame (not including the headgear) CPAP tubing Every 6 months Mask headgear Chin strap (if applicable) Humidifier water tub  Follow up in one year with Dr. Isaiah or Izetta Malachy PIETY. If symptoms do not improve or worsen, please contact office for sooner follow up    Contine usando CPAP todas las noches, un mnimo de 4 a 6 horas por noche.  Cambie el equipo segn las indicaciones. Lave el tubo con agua tibia y jabn diariamente y culguelo para que se seque. Lave la porcin del humidificador semanalmente. Utilice agua destilada embotellada y nepal diariamente. Tenga en cuenta la reduccin del estado de alerta y no conduzca ni opere maquinaria pesada si experimenta esto o somnolencia.  Se recomienda el ejercicio, segn se tolere. Se discute el control de peso saludable.  Evite o disminuya el consumo de alcohol y [employer] produzcan ms sueo, si es posible. Notifique si se produce somnolencia diurna persistente incluso con el uso constante de la terapia PAP.  Cambiar suministros de CPAP... Cada mes Almohadillas para mascarilla y/o almohadillas nasales Filtros para mquinas CPAP Cada 3 meses [place] (sin incluir el arns) tubo de CPAP Cada 6 meses Mscara de tocado [place] para la  barbilla (si corresponde) [place] de agua humidificador  Haga un seguimiento en un ao con el Dr. Isaiah malva Izetta Malachy, NP. Si los sntomas no mejoran o empeoran, comunquese con el consultorio para un seguimiento ms rpido.   Class 2 obesity with body mass index (BMI) of 37.0 to 37.9 in adult BMI 37. Healthy weight loss measures encouraged  Advised if symptoms do not improve or worsen, to please contact office for sooner follow up or seek emergency care.   I spent 25 minutes of dedicated to the care of this patient on the date of this encounter to include pre-visit review of records, face-to-face time with the patient discussing conditions above, post visit ordering of testing, clinical documentation with the electronic health record, making appropriate referrals as documented, and communicating necessary findings to members of the patients care team.  Comer LULLA Malachy, NP 02/05/2024  Pt aware and understands NP's  role.
